# Patient Record
Sex: Male | Born: 1965 | Hispanic: Yes | Marital: Married | Smoking: Current some day smoker
Health system: Southern US, Community
[De-identification: ages and names within clinical notes are randomized; demographics above are authoritative.]

## PROBLEM LIST (undated history)

## (undated) DIAGNOSIS — Q600 Renal agenesis, unilateral: Secondary | ICD-10-CM

## (undated) DIAGNOSIS — J301 Allergic rhinitis due to pollen: Secondary | ICD-10-CM

## (undated) DIAGNOSIS — E785 Hyperlipidemia, unspecified: Secondary | ICD-10-CM

## (undated) DIAGNOSIS — G4730 Sleep apnea, unspecified: Secondary | ICD-10-CM

## (undated) DIAGNOSIS — IMO0002 Reserved for concepts with insufficient information to code with codable children: Secondary | ICD-10-CM

## (undated) HISTORY — DX: Hyperlipidemia, unspecified: E78.5

## (undated) HISTORY — DX: Renal agenesis, unilateral: Q60.0

## (undated) HISTORY — PX: OTHER SURGICAL HISTORY: SHX169

## (undated) HISTORY — DX: Sleep apnea, unspecified: G47.30

## (undated) HISTORY — DX: Allergic rhinitis due to pollen: J30.1

---

## 2011-06-13 ENCOUNTER — Encounter (HOSPITAL_BASED_OUTPATIENT_CLINIC_OR_DEPARTMENT_OTHER): Payer: Self-pay

## 2011-07-11 ENCOUNTER — Ambulatory Visit (HOSPITAL_BASED_OUTPATIENT_CLINIC_OR_DEPARTMENT_OTHER): Payer: BC Managed Care – PPO | Attending: Family Medicine | Admitting: Radiology

## 2011-07-11 VITALS — Ht 67.0 in | Wt 240.0 lb

## 2011-07-11 DIAGNOSIS — I4949 Other premature depolarization: Secondary | ICD-10-CM | POA: Insufficient documentation

## 2011-07-11 DIAGNOSIS — G4733 Obstructive sleep apnea (adult) (pediatric): Secondary | ICD-10-CM | POA: Insufficient documentation

## 2011-07-11 DIAGNOSIS — G4730 Sleep apnea, unspecified: Secondary | ICD-10-CM

## 2011-07-20 DIAGNOSIS — I4949 Other premature depolarization: Secondary | ICD-10-CM

## 2011-07-20 DIAGNOSIS — G4733 Obstructive sleep apnea (adult) (pediatric): Secondary | ICD-10-CM

## 2011-07-20 NOTE — Procedures (Signed)
NAME:  Bryan Case, Bryan Case NO.:  [id number]  MEDICAL RECORD NO.:  [id number]          PATIENT TYPE:  OUT  LOCATION:  SLEEP CENTER                 FACILITY:  [facility]  PHYSICIAN:  Clinton D. Maple Hudson, MD, FCCP, FACPDATE OF BIRTH:  1967-02-05  DATE OF STUDY:  07/11/2011                           NOCTURNAL POLYSOMNOGRAM  REFERRING PHYSICIAN:  Jamey Reas, MD  REFERRING PHYSICIAN:  Jamey Reas, MD  INDICATION FOR STUDY:  Hypersomnia with sleep apnea.  EPWORTH SLEEPINESS SCORE:  16/24.  BMI 37.6, weight 240 pounds, height 67 inches, neck 18.5 inches.  MEDICATIONS:  Home medications are charted and reviewed.  SLEEP ARCHITECTURE:  Split study protocol.  During the diagnostic phase, total sleep time 120 minutes with sleep efficiency 61.4%.  Stage I was 40.4%, stage II 59.6%, stages III and REM were absent.  Sleep latency 28.5 minutes, awake after sleep onset 50.5 minutes, arousal index 85.5.  Bedtime medication:  None.  RESPIRATORY DATA:  Apnea-hypotony index (AHI) 75.5 per hour.  A total of 151 events was scored including 58 obstructive apneas, 20 central apneas, 1 mixed apneas, 72 hypopneas.  Events were not positional.  CPAP was then titrated to 11 CWP, AHI 0 per hour .  He wore a large ResMed that Mirage SoftGel nasal mask with heated humidifier and C-Flex of 3.  OXYGEN DATA:  Moderately loud snoring before CPAP with oxygen desaturation to a nadir of 79%.  With CPAP titration, snoring was prevented and mean oxygen saturation held 93.9% on room air.  CARDIAC DATA:  Sinus rhythm with PVCs.  MOVEMENT-PARASOMNIA:  Occasional limb jerk with insignificant effect on sleep.  Bathroom x1.  IMPRESSIONS-RECOMMENDATIONS: 1. Severe obstructive sleep apnea/hypoxemia syndrome apnea/hypopnea     index 75.5 per hour with moderately loud snoring, non-positional     events and oxygen desaturation to a nadir of 79% on room air. 2. Successful continuous positive airway  pressure titration to 11 cm     of water pressure, apnea/hypopnea index 0     per hour.  He wore a large ResMed Mirage SoftGel nasal mask with     heated humidifier and C-Flex of 3.  Sleep was much less fragmented     and more normal once CPAP was established.     Clinton D. Maple Hudson, MD, [facility], FACP Diplomate, Biomedical engineer of Sleep Medicine Electronically Signed    CDY/MEDQ  D:  07/20/2011 09:39:03  T:  07/20/2011 09:49:27  Job:  [id number]

## 2011-09-13 ENCOUNTER — Other Ambulatory Visit: Payer: Self-pay | Admitting: Sports Medicine

## 2011-09-13 DIAGNOSIS — M25552 Pain in left hip: Secondary | ICD-10-CM

## 2011-09-19 ENCOUNTER — Ambulatory Visit
Admission: RE | Admit: 2011-09-19 | Discharge: 2011-09-19 | Disposition: A | Discharge status: Home / Self Care | Payer: BC Managed Care – PPO | Source: Ambulatory Visit | Attending: Sports Medicine | Admitting: Sports Medicine

## 2011-09-19 DIAGNOSIS — M25552 Pain in left hip: Secondary | ICD-10-CM

## 2012-01-07 ENCOUNTER — Emergency Department (INDEPENDENT_AMBULATORY_CARE_PROVIDER_SITE_OTHER): Payer: BC Managed Care – PPO

## 2012-01-07 ENCOUNTER — Encounter (HOSPITAL_COMMUNITY): Payer: Self-pay | Admitting: *Deleted

## 2012-01-07 ENCOUNTER — Emergency Department (INDEPENDENT_AMBULATORY_CARE_PROVIDER_SITE_OTHER)
Admission: EM | Admit: 2012-01-07 | Discharge: 2012-01-07 | Disposition: A | Discharge status: Home / Self Care | Payer: BC Managed Care – PPO | Source: Home / Self Care | Attending: Emergency Medicine | Admitting: Emergency Medicine

## 2012-01-07 DIAGNOSIS — K5732 Diverticulitis of large intestine without perforation or abscess without bleeding: Secondary | ICD-10-CM

## 2012-01-07 DIAGNOSIS — K5792 Diverticulitis of intestine, part unspecified, without perforation or abscess without bleeding: Secondary | ICD-10-CM

## 2012-01-07 HISTORY — DX: Reserved for concepts with insufficient information to code with codable children: IMO0002

## 2012-01-07 HISTORY — DX: Renal agenesis, unilateral: Q60.0

## 2012-01-07 LAB — CBC
HCT: 43 % (ref 39.0–52.0)
MCH: 28.2 pg (ref 26.0–34.0)
MCV: 82.5 fL (ref 78.0–100.0)
Platelets: 232 10*3/uL (ref 150–400)
RDW: 12.7 % (ref 11.5–15.5)

## 2012-01-07 LAB — DIFFERENTIAL
Basophils Absolute: 0 10*3/uL (ref 0.0–0.1)
Eosinophils Absolute: 0.1 10*3/uL (ref 0.0–0.7)
Eosinophils Relative: 2 % (ref 0–5)
Lymphocytes Relative: 26 % (ref 12–46)
Lymphs Abs: 1.8 10*3/uL (ref 0.7–4.0)
Monocytes Absolute: 0.5 10*3/uL (ref 0.1–1.0)

## 2012-01-07 LAB — POCT URINALYSIS DIP (DEVICE)
Glucose, UA: NEGATIVE mg/dL
Ketones, ur: NEGATIVE mg/dL
Specific Gravity, Urine: >=1.03 (ref 1.005–1.030)
Urobilinogen, UA: 0.2 mg/dL (ref 0.0–1.0)

## 2012-01-07 LAB — OCCULT BLOOD, POC DEVICE: Fecal Occult Bld: NEGATIVE

## 2012-01-07 MED ORDER — CIPROFLOXACIN HCL 500 MG PO TABS: 500.0000 mg | ORAL_TABLET | Freq: Two times a day (BID) | ORAL | Status: AC

## 2012-01-07 MED ORDER — TRAMADOL HCL 50 MG PO TABS: 100.0000 mg | ORAL_TABLET | Freq: Three times a day (TID) | ORAL | Status: AC | PRN

## 2012-01-07 MED ORDER — METRONIDAZOLE 500 MG PO TABS: 500.0000 mg | ORAL_TABLET | Freq: Two times a day (BID) | ORAL | Status: AC

## 2012-01-07 NOTE — ED Notes (Signed)
Pt  Reports  l  Side pain  Radiating  Downward  Into llq   Started  Today    -  denys  Any  Nausea  Or  Any  Vomiting  denys  Any  Diarrhea      Pt  Reports  He  Only  Has  One  Kidney   Stone      Which is  On the    r  Side  -  He  Reports  He  Has  Had  Similar  Episode     Like  This  In  Past  But it went away

## 2012-01-07 NOTE — Discharge Instructions (Signed)
Diverticulitis A diverticulum is a small pouch or sac on the colon. Diverticulosis is the presence of these diverticula on the colon. Diverticulitis is the irritation (inflammation) or infection of diverticula. CAUSES  The colon and its diverticula contain bacteria. If food particles block the tiny opening to a diverticulum, the bacteria inside can grow and cause an increase in pressure. This leads to infection and inflammation and is called diverticulitis. SYMPTOMS   Abdominal pain and tenderness. Usually, the pain is located on the left side of your abdomen. However, it could be located elsewhere.   Fever.   Bloating.   Feeling sick to your stomach (nausea).   Throwing up (vomiting).   Abnormal stools.  DIAGNOSIS  Your caregiver will take a history and perform a physical exam. Since many things can cause abdominal pain, other tests may be necessary. Tests may include:  Blood tests.   Urine tests.   X-ray of the abdomen.   CT scan of the abdomen.  Sometimes, surgery is needed to determine if diverticulitis or other conditions are causing your symptoms. TREATMENT  Most of the time, you can be treated without surgery. Treatment includes:  Resting the bowels by only having liquids for a few days. As you improve, you will need to eat a low-fiber diet.   Intravenous (IV) fluids if you are losing body fluids (dehydrated).   Antibiotic medicines that treat infections may be given.   Pain and nausea medicine, if needed.   Surgery if the inflamed diverticulum has burst.  HOME CARE INSTRUCTIONS   Try a clear liquid diet (broth, tea, or water for as long as directed by your caregiver). You may then gradually begin a low-fiber diet as tolerated. A low-fiber diet is a diet with less than 10 grams of fiber. Choose the foods below to reduce fiber in the diet:   White breads, cereals, rice, and pasta.   Cooked fruits and vegetables or soft fresh fruits and vegetables without the skin.     Ground or well-cooked tender beef, ham, veal, lamb, pork, or poultry.   Eggs and seafood.   After your diverticulitis symptoms have improved, your caregiver may put you on a high-fiber diet. A high-fiber diet includes 14 grams of fiber for every 1000 calories consumed. For a standard 2000 calorie diet, you would need 28 grams of fiber. Follow these diet guidelines to help you increase the fiber in your diet. It is important to slowly increase the amount fiber in your diet to avoid gas, constipation, and bloating.   Choose whole-grain breads, cereals, pasta, and brown rice.   Choose fresh fruits and vegetables with the skin on. Do not overcook vegetables because the more vegetables are cooked, the more fiber is lost.   Choose more nuts, seeds, legumes, dried peas, beans, and lentils.   Look for food products that have greater than 3 grams of fiber per serving on the Nutrition Facts label.   Take all medicine as directed by your caregiver.   If your caregiver has given you a follow-up appointment, it is very important that you go. Not going could result in lasting (chronic) or permanent injury, pain, and disability. If there is any problem keeping the appointment, call to reschedule.  SEEK MEDICAL CARE IF:   Your pain does not improve.   You have a hard time advancing your diet beyond clear liquids.   Your bowel movements do not return to normal.  SEEK IMMEDIATE MEDICAL CARE IF:   Your pain becomes   worse.   You have an oral temperature above 102 F (38.9 C), not controlled by medicine.   You have repeated vomiting.   You have bloody or black, tarry stools.   Symptoms that brought you to your caregiver become worse or are not getting better.  MAKE SURE YOU:   Understand these instructions.   Will watch your condition.   Will get help right away if you are not doing well or get worse.  Document Released: 05/08/2005 Document Revised: 07/18/2011 Document Reviewed:  09/03/2010 [facility] Patient Information 2012 [place], [place].   Liquid diet for next 2 days, then regular diet, but no nuts, seeds, or corn.  Follow up with Dr. Cyndia Bent in 1 week.

## 2012-01-07 NOTE — ED Provider Notes (Signed)
Chief Complaint  Patient presents with  . Abdominal Pain    History of Present Illness:   The patient is a 46 year old male with a five-year history of intermittent abdominal pain. This seems to begin the left flank pain radiates towards the left groin. It comes and goes and occurs about every other week. It sometimes seems to be brought on by sexual intercourse. The pain can last about 2-3 days at a time. Current episode began this morning. Sharp and stabbing and rated 7-8/10 in intensity. The patient states she's been back and forth to many doctors for this pain. His current doctor is Dr. Customer service manager. Nothing has been found in many tests. 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 He had a colonoscopy about 5 years ago. He has a history of some type of hepatitis in the past. He denies fever, chills, anorexia, or weight loss. No nausea, vomiting, diarrhea, constipation, or blood in the stool. No urinary symptoms.  Review of Systems:  Other than noted above, the patient denies any of the following symptoms: Constitutional:  No fever, chills, fatigue, weight loss or anorexia. Lungs:  No cough or shortness of breath. Heart:  No chest pain, palpitations, syncope or edema.  No cardiac history. Abdomen:  No nausea, vomiting, hematememesis, melena, diarrhea, or hematochezia. GU:  No dysuria, frequency, urgency, or hematuria.  No testicular pain or swelling. Skin:  No rash or itching.  PMFSH:  Past medical history, family history, social history, meds, and allergies were reviewed.  No prior abdominal surgeries or history of GI problems.  No use of NSAIDs or aspirin.  No excessive  alcohol intake.  Physical Exam:   Vital signs:  BP 122/78  Pulse 94  Temp(Src) 98.2 F (36.8 C) (Oral)  Resp 18  SpO2 96% Gen:  Alert, oriented, in no distress. Lungs:  Breath sounds clear and equal bilaterally.  No wheezes, rales or rhonchi. Heart:  Regular rhythm.   No gallops or murmers.   Abdomen:  Abdomen was flat, soft, nondistended. There was mild pain to palpation in the left flank area without guarding or rebound. No organomegaly or mass. Bowel sounds are normally active. Genital exam:  Normal external genitalia, no testicular tenderness, no hernia. Digital rectal exam reveals no masses, normal prostate, no prostatic tenderness, and heme-negative stool. Skin:  Clear, warm and dry.  No rash.  Labs:   Results for orders placed during the hospital encounter of 01/07/12  POCT URINALYSIS DIP (DEVICE)      Component Value Range   Glucose, UA NEGATIVE  NEGATIVE (mg/dL)   Bilirubin Urine SMALL (*) NEGATIVE    Ketones, ur NEGATIVE  NEGATIVE (mg/dL)   Specific Gravity, Urine >=1.030  1.005 - 1.030    Hgb urine dipstick NEGATIVE  NEGATIVE    pH 5.5  5.0 - 8.0    Protein, ur NEGATIVE  NEGATIVE (mg/dL)   Urobilinogen, UA 0.2  0.0 - 1.0 (mg/dL)   Nitrite NEGATIVE  NEGATIVE    Leukocytes, UA NEGATIVE  NEGATIVE   CBC      Component Value Range   WBC 7.2  4.0 - 10.5 (K/uL)   RBC 5.21  4.22 - 5.81 (MIL/uL)   Hemoglobin 14.7  13.0 - 17.0 (g/dL)   HCT 16.1  09.6 - 04.5 (%)   MCV 82.5  78.0 - 100.0 (fL)   MCH 28.2  26.0 - 34.0 (pg)   MCHC  34.2  30.0 - 36.0 (g/dL)   RDW 41.3  24.4 - 01.0 (%)   Platelets 232  150 - 400 (K/uL)  DIFFERENTIAL      Component Value Range   Neutrophils Relative 64  43 - 77 (%)   Neutro Abs 4.6  1.7 - 7.7 (K/uL)   Lymphocytes Relative 26  12 - 46 (%)   Lymphs Abs 1.8  0.7 - 4.0 (K/uL)   Monocytes Relative 7  3 - 12 (%)   Monocytes Absolute 0.5  0.1 - 1.0 (K/uL)   Eosinophils Relative 2  0 - 5 (%)   Eosinophils Absolute 0.1  0.0 - 0.7 (K/uL)   Basophils Relative 1  0 - 1 (%)   Basophils Absolute 0.0  0.0 - 0.1 (K/uL)  OCCULT BLOOD, POC DEVICE      Component Value Range   Fecal Occult Bld NEGATIVE       Radiology:  Dg Abd Acute W/chest  01/07/2012  *RADIOLOGY REPORT*  Clinical Data: Left flank pain radiating to the groin  for 3 days  ACUTE ABDOMEN SERIES (ABDOMEN 2 VIEW & CHEST 1 VIEW)  Comparison: None.  Findings: The lungs are clear.  The heart is within upper limits of normal.  No bony abnormality is seen.  Supine and erect views of the abdomen show no bowel obstruction. On the chest x-ray no free air is seen on the erect view.  No opaque calculi are noted.  There are degenerative changes in the lower lumbar spine.  IMPRESSION:  1.  No active lung disease. 2.  No bowel obstruction.  No free air.  Original Report Authenticated By: Juline Patch, M.D.    Assessment:  The encounter diagnosis was Diverticulitis. Differential diagnosis includes diverticulitis, urinary tract infection, irritable bowel syndrome, or colitis.  Plan:   1.  The following meds were prescribed:   New Prescriptions   CIPROFLOXACIN (CIPRO) 500 MG TABLET    Take 1 tablet (500 mg total) by mouth every 12 (twelve) hours.   METRONIDAZOLE (FLAGYL) 500 MG TABLET    Take 1 tablet (500 mg total) by mouth 2 (two) times daily.   TRAMADOL (ULTRAM) 50 MG TABLET    Take 2 tablets (100 mg total) by mouth every 8 (eight) hours as needed for pain.   2.  The patient was instructed in symptomatic care and handouts were given. 3.  The patient was told to return if becoming worse in any way, if no better in 3 or 4 days, and given some red flag symptoms that would indicate earlier return.  Follow up:  The patient was told to follow up with Dr. Cyndia Bent in one week, or if he should get worse in any way, to go directly to the hospital emergency room.    Reuben Likes, MD 01/07/12 2228

## 2012-08-19 DIAGNOSIS — K145 Plicated tongue: Secondary | ICD-10-CM | POA: Insufficient documentation

## 2012-08-19 DIAGNOSIS — E119 Type 2 diabetes mellitus without complications: Secondary | ICD-10-CM | POA: Insufficient documentation

## 2013-05-15 ENCOUNTER — Encounter (HOSPITAL_COMMUNITY): Payer: Self-pay | Admitting: *Deleted

## 2013-05-15 ENCOUNTER — Emergency Department (INDEPENDENT_AMBULATORY_CARE_PROVIDER_SITE_OTHER)
Admission: EM | Admit: 2013-05-15 | Discharge: 2013-05-15 | Disposition: A | Discharge status: Home / Self Care | Payer: BC Managed Care – PPO | Source: Home / Self Care | Attending: Emergency Medicine | Admitting: Emergency Medicine

## 2013-05-15 DIAGNOSIS — H60399 Other infective otitis externa, unspecified ear: Secondary | ICD-10-CM

## 2013-05-15 DIAGNOSIS — H6092 Unspecified otitis externa, left ear: Secondary | ICD-10-CM

## 2013-05-15 MED ORDER — CIPROFLOXACIN-DEXAMETHASONE 0.3-0.1 % OT SUSP: 4.0000 [drp] | Freq: Two times a day (BID) | OTIC | Status: DC

## 2013-05-15 MED ORDER — AMOXICILLIN-POT CLAVULANATE 875-125 MG PO TABS: 1.0000 | ORAL_TABLET | Freq: Two times a day (BID) | ORAL | Status: DC

## 2013-05-15 NOTE — ED Provider Notes (Signed)
Chief Complaint:   Chief Complaint  Patient presents with  . Otalgia    History of Present Illness:   Bryan Case. is a 47 year old male who has had a two-day history of left ear pain and sensation of congestion. There's been no drainage. He's had some postnasal drainage and cough. No fever, chills, headache, nasal congestion, nasal drainage, sore throat, or adenopathy. He has diet controlled diabetes and sleep apnea.  Review of Systems:  Other than noted above, the patient denies any of the following symptoms: Systemic:  No fevers, chills, sweats, weight loss or gain, fatigue, or tiredness. Eye:  No redness, pain, discharge, itching, blurred vision, or diplopia. ENT:  No headache, nasal congestion, sneezing, itching, epistaxis, ear pain, congestion, decreased hearing, ringing in ears, vertigo, or tinnitus.  No oral lesions, sore throat, pain on swallowing, or hoarseness. Neck:  No mass, tenderness or adenopathy. Lungs:  No coughing, wheezing, or shortness of breath. Skin:  No rash or itching.  PMFSH:  Past medical history, family history, social history, meds, and allergies were reviewed.   Physical Exam:   Vital signs:  BP 127/80  Pulse 70  Temp(Src) 98.2 F (36.8 C) (Oral)  Resp 17  SpO2 100% General:  Alert and oriented.  In no distress.  Skin warm and dry. Eye:  PERRL, full EOMs, lids and conjunctiva normal.   ENT:  He had 2 swollen areas in his distal, external ear canal, just inside the canal, suggesting small furuncles. Neither these was draining any pus or felt fluctuant but there were tender to palpation. Was unable to see much of the ear canal or TM itself due to the swelling of the external ear canal.  Nasal mucosa not congested and without drainage.  Mucous membranes moist, no oral lesions, normal dentition, pharynx clear.  No cranial or facial pain to palplation. Neck:  Supple, full ROM.  No adenopathy, tenderness or mass.  Thyroid normal. Lungs:  Breath sounds clear and  equal bilaterally.  No wheezes, rales or rhonchi. Heart:  Rhythm regular, without extrasystoles.  No gallops or murmers. Skin:  Clear, warm and dry.  Course in Urgent Care Center:   An ear wick was inserted.  Assessment:  The encounter diagnosis was Otitis externa, left.  Otitis externa versus furuncles of external ear canal.  Plan:   1.  Meds:  The following meds were prescribed:   Discharge Medication List as of 05/15/2013  1:33 PM    START taking these medications   Details  amoxicillin-clavulanate (AUGMENTIN) 875-125 MG per tablet Take 1 tablet by mouth 2 (two) times daily., Starting 05/15/2013, Until Discontinued, Normal    ciprofloxacin-dexamethasone (CIPRODEX) otic suspension Place 4 drops into the left ear 2 (two) times daily., Starting 05/15/2013, Until Discontinued, Normal       The patient called back and states he could not afford the Ciprodex. Prescription was sent instead for Cortisporin otic suspension, 3 drops in left ear 4 times daily.  2.  Patient Education/Counseling:  The patient was given appropriate handouts, self care instructions, and instructed in symptomatic relief.  Should keep water out of the ear.  3.  Follow up:  The patient was told to follow up if no better in 3 to 4 days, if becoming worse in any way, and given some red flag symptoms such as worsening pain which would prompt immediate return.  Follow up with Dr. Suzanna Obey as needed.     Reuben Likes, MD 05/15/13 2033

## 2013-05-15 NOTE — ED Notes (Signed)
Pt  Reports  l  Earache         X  2  Days    With           Symptoms  Of  Muffled      Sounds            The  Pt        Reports  Pain is  In  The  Canal           Pt  Has  A  History  Of  Diabetes   But  Takes  No  meds

## 2013-05-15 NOTE — ED Notes (Signed)
Pharmacy called stating that pt can't afford the Ciprodex Per Dr. Lorenz Coaster... Called in cortisporin otic solution... 3 drops to left ear QID... 10ml ... NR

## 2014-08-12 HISTORY — PX: NECK SURGERY: SHX720

## 2014-12-13 ENCOUNTER — Emergency Department (HOSPITAL_COMMUNITY)
Admission: EM | Admit: 2014-12-13 | Discharge: 2014-12-14 | Disposition: A | Discharge status: Home / Self Care | Payer: 59 | Attending: Emergency Medicine | Admitting: Emergency Medicine

## 2014-12-13 ENCOUNTER — Encounter (HOSPITAL_COMMUNITY): Payer: Self-pay | Admitting: Family Medicine

## 2014-12-13 ENCOUNTER — Emergency Department (HOSPITAL_COMMUNITY): Payer: 59

## 2014-12-13 DIAGNOSIS — M542 Cervicalgia: Secondary | ICD-10-CM | POA: Diagnosis not present

## 2014-12-13 DIAGNOSIS — Z87448 Personal history of other diseases of urinary system: Secondary | ICD-10-CM | POA: Insufficient documentation

## 2014-12-13 DIAGNOSIS — R0789 Other chest pain: Secondary | ICD-10-CM | POA: Diagnosis not present

## 2014-12-13 DIAGNOSIS — R9431 Abnormal electrocardiogram [ECG] [EKG]: Secondary | ICD-10-CM | POA: Insufficient documentation

## 2014-12-13 DIAGNOSIS — Z79899 Other long term (current) drug therapy: Secondary | ICD-10-CM | POA: Diagnosis not present

## 2014-12-13 DIAGNOSIS — R079 Chest pain, unspecified: Secondary | ICD-10-CM | POA: Diagnosis present

## 2014-12-13 DIAGNOSIS — R202 Paresthesia of skin: Secondary | ICD-10-CM | POA: Diagnosis not present

## 2014-12-13 DIAGNOSIS — E119 Type 2 diabetes mellitus without complications: Secondary | ICD-10-CM | POA: Insufficient documentation

## 2014-12-13 DIAGNOSIS — R0602 Shortness of breath: Secondary | ICD-10-CM | POA: Diagnosis not present

## 2014-12-13 DIAGNOSIS — R000 Tachycardia, unspecified: Secondary | ICD-10-CM | POA: Diagnosis not present

## 2014-12-13 LAB — CBC
HCT: 46.4 % (ref 39.0–52.0)
HEMOGLOBIN: 15.5 g/dL (ref 13.0–17.0)
MCH: 27.9 pg (ref 26.0–34.0)
MCHC: 33.4 g/dL (ref 30.0–36.0)
MCV: 83.6 fL (ref 78.0–100.0)
Platelets: 270 10*3/uL (ref 150–400)
RBC: 5.55 MIL/uL (ref 4.22–5.81)
RDW: 12.6 % (ref 11.5–15.5)
WBC: 8 10*3/uL (ref 4.0–10.5)

## 2014-12-13 LAB — BASIC METABOLIC PANEL
Anion gap: 10 (ref 5–15)
BUN: 13 mg/dL (ref 6–20)
CALCIUM: 8.9 mg/dL (ref 8.9–10.3)
CO2: 24 mmol/L (ref 22–32)
Chloride: 106 mmol/L (ref 101–111)
Creatinine, Ser: 0.88 mg/dL (ref 0.61–1.24)
GFR calc Af Amer: >60 mL/min (ref >60)
GLUCOSE: 113 mg/dL — AB (ref 70–99)
Potassium: 3.5 mmol/L (ref 3.5–5.1)
SODIUM: 140 mmol/L (ref 135–145)

## 2014-12-13 LAB — I-STAT TROPONIN, ED: Troponin i, poc: 0.01 ng/mL (ref 0.00–0.08)

## 2014-12-13 LAB — BRAIN NATRIURETIC PEPTIDE: B NATRIURETIC PEPTIDE 5: 16.6 pg/mL (ref 0.0–100.0)

## 2014-12-13 MED ORDER — MORPHINE SULFATE 4 MG/ML IJ SOLN
4.0000 mg | Freq: Once | INTRAMUSCULAR | Status: AC
  Administered 2014-12-14: 4 mg via INTRAVENOUS
  Filled 2014-12-13: qty 1

## 2014-12-13 MED ORDER — ASPIRIN 325 MG PO TABS
325.0000 mg | ORAL_TABLET | Freq: Once | ORAL | Status: AC
  Administered 2014-12-14: 325 mg via ORAL
  Filled 2014-12-13: qty 1

## 2014-12-13 NOTE — ED Provider Notes (Signed)
CSN: [id number]     Arrival date & time 12/13/14  2135 History   First MD Initiated Contact with Patient 12/13/14 2324     Chief Complaint  Patient presents with  . Chest Pain     (Consider location/radiation/quality/duration/timing/severity/associated sxs/prior Treatment) HPI Comments: Bryan Case. is a 49 y.o. male with a PMHx of DM2 and solitary kidney, who presents to the ED with complaints of 2 days of constant substernal and left-sided chest pain which has waxed and waned and become more severe today. He reports that the pain is 6/10 located in the substernal and left chest wall area, nonradiating, constant, sharp in nature, worse with deep inspiration, unchanged with exertion or movement, and no known alleviating factors given that he has not tried anything prior to arrival. He admits to being under a lot of stress and wonders whether this is contributing to his chest pain. He denies any radiation of his symptoms to his jaw/arm/neck, although he states that for the last 3 months he has been having ongoing neck pain and bilateral arm paresthesias and pain for which she was seen at [facility] in [place] and he has an MRI and EMG/NCS that is scheduled, and states he has no change in this symptom now. He reports associated shortness of breath. He denies any diaphoresis, lightheadedness, dizziness, fevers, chills, cough, hemoptysis, wheezing, leg swelling, surgery/immobilization/travel, history of DVT, abdominal pain, dysphagia, heartburn, nausea, vomiting, diarrhea, constipation, dysuria, hematuria, new/changed numbness or tingling, focal weakness, headache, vision changes, claudication, orthopnea, or PND. He is a nonsmoker. He has a FHx of PE in his father, who died of PE at age 55. No other family hx of DVT/PE or cardiac disease. No known cardiac disease in himself.  Patient is a 49 y.o. male presenting with chest pain. The history is provided by the patient. No language interpreter was  used.  Chest Pain Pain location:  Substernal area and L chest Pain quality: sharp   Pain radiates to:  Does not radiate Pain radiates to the back: no   Pain severity:  Moderate Onset quality:  Gradual Duration:  2 days Timing:  Constant Progression:  Waxing and waning Chronicity:  New Context: at rest   Relieved by:  None tried Worsened by:  Deep breathing Ineffective treatments:  None tried Associated symptoms: shortness of breath   Associated symptoms: no abdominal pain, no back pain, no claudication, no cough, no diaphoresis, no dizziness, no fever, no headache, no heartburn, no lower extremity edema, no nausea, no near-syncope, no numbness, no orthopnea, no palpitations, no PND, not vomiting and no weakness   Risk factors: diabetes mellitus and male sex   Risk factors: no coronary artery disease, no high cholesterol, no hypertension, no immobilization, no prior DVT/PE, no smoking and no surgery     Past Medical History  Diagnosis Date  . Diabetes mellitus   . Solitary kidney    History reviewed. No pertinent past surgical history. History reviewed. No pertinent family history. History  Substance Use Topics  . Smoking status: Never Smoker   . Smokeless tobacco: Not on file  . Alcohol Use: No    Review of Systems  Constitutional: Negative for fever, chills and diaphoresis.  Eyes: Negative for visual disturbance.  Respiratory: Positive for shortness of breath. Negative for cough and wheezing.   Cardiovascular: Positive for chest pain. Negative for palpitations, orthopnea, claudication, leg swelling, PND and near-syncope.  Gastrointestinal: Negative for heartburn, nausea, vomiting, abdominal pain, diarrhea and constipation.  Genitourinary:  Negative for dysuria and hematuria.  Musculoskeletal: Positive for neck pain (ongoing x3 months, evaluated by neurology this AM, no acute changes in this). Negative for myalgias, back pain and arthralgias.  Skin: Negative for rash.    Allergic/Immunologic: Positive for immunocompromised state (diabetic).  Neurological: Negative for dizziness, syncope, weakness, light-headedness, numbness and headaches.       +paresthesias b/l upper extremities ongoing x3 months, already seen by neurology, no acute changes  Psychiatric/Behavioral: Negative for confusion.   10 Systems reviewed and are negative for acute change except as noted in the HPI.    Allergies  Review of patient's allergies indicates no known allergies.  Home Medications   Prior to Admission medications   Medication Sig Start Date End Date Taking? Authorizing Provider  amoxicillin-clavulanate (AUGMENTIN) 875-125 MG per tablet Take 1 tablet by mouth 2 (two) times daily. 05/15/13   Reuben Likesavid C Keller, MD  ciprofloxacin-dexamethasone [place](CIPRODEX) otic suspension Place 4 drops into the left ear 2 (two) times daily. 05/15/13   Reuben Likesavid C Keller, MD   BP 119/68 mmHg  Pulse 73  Temp(Src) 97.9 F (36.6 C) (Oral)  Resp 18  SpO2 97% Physical Exam  Constitutional: He is oriented to person, place, and time. Vital signs are normal. He appears well-developed and well-nourished.  Non-toxic appearance. No distress.  Afebrile, nontoxic, NAD, VSS although triage vitals showed tachycardia initially  HENT:  Head: Normocephalic and atraumatic.  Mouth/Throat: Oropharynx is clear and moist and mucous membranes are normal.  Eyes: Conjunctivae and EOM are normal. Right eye exhibits no discharge. Left eye exhibits no discharge.  Neck: Normal range of motion. Neck supple. No JVD present.  No JVD  Cardiovascular: Normal rate, regular rhythm, normal heart sounds and intact distal pulses.  Exam reveals no gallop and no friction rub.   No murmur heard. Triage vitals showed tachycardia which resolved upon exam, HR 73, RRR, nl s1/s2, no m/r/g, distal pulses intact, no pedal edema   Pulmonary/Chest: Effort normal and breath sounds normal. No respiratory distress. He has no decreased breath sounds.  He has no wheezes. He has no rhonchi. He has no rales. He exhibits tenderness. He exhibits no crepitus, no deformity and no retraction.    CTAB in all lung fields, no w/r/r, no hypoxia or increased WOB, speaking in full sentences, SpO2 97% on RA Chest wall TTP over L chest, no crepitus or deformity, no retractions.  Abdominal: Soft. Normal appearance and bowel sounds are normal. He exhibits no distension. There is no tenderness. There is no rigidity, no rebound, no guarding, no CVA tenderness, no tenderness at McBurney's point and negative Murphy's sign.  Musculoskeletal: Normal range of motion.  MAE x4 Strength and sensation grossly intact Distal pulses intact No pedal edema, neg homan's bilaterally   Neurological: He is alert and oriented to person, place, and time. He has normal strength. No sensory deficit.  Skin: Skin is warm, dry and intact. No rash noted.  Psychiatric: He has a normal mood and affect.  Nursing note and vitals reviewed.   ED Course  Procedures (including critical care time) Labs Review Labs Reviewed  BASIC METABOLIC PANEL - Abnormal; Notable for the following:    Glucose, Bld 113 (*)    All other components within normal limits  CBC  BRAIN NATRIURETIC PEPTIDE  D-DIMER, QUANTITATIVE  I-STAT TROPOININ, ED  Rosezena SensorI-STAT TROPOININ, ED    Imaging Review Dg Chest 2 View  12/13/2014   CLINICAL DATA:  Shortness of breath with mid chest pain for 2  days, worsening numbness and tingling in both hands, history diabetes  EXAM: CHEST  2 VIEW  COMPARISON:  None  FINDINGS: Normal heart size, mediastinal contours, and pulmonary vascularity.  Mild peribronchial thickening.  No pulmonary infiltrate, pleural effusion, or pneumothorax.  Bones unremarkable.  IMPRESSION: Mild bronchitic changes.   Electronically Signed   By: Ulyses Southward M.D.   On: 12/13/2014 21:56     EKG Interpretation   Date/Time:  Tuesday Dec 13 2014 21:38:09 EDT Ventricular Rate:  83 PR Interval:  132 QRS  Duration: 106 QT Interval:  340 QTC Calculation: 399 R Axis:   84 Text Interpretation:  Sinus rhythm with marked sinus arrhythmia ST \\T \ T  wave abnormality, consider inferior ischemia Abnormal ECG No previous  tracing Confirmed by BEATON  MD, ROBERT ([id number]) on 12/13/2014 9:41:10 PM      MDM   Final diagnoses:  Musculoskeletal chest pain  SOB (shortness of breath)  Abnormal EKG    49 y.o. male here with chest pain and SOB that's reproducible on exam, ongoing x2 days but this morning it became more intense. Pain is pleuritic, and pt has family hx of PE in father resulting in death at 30y/o. Pt was initially tachycardic, therefore given these findings will proceed with dimer. On exam, tachycardia had resolved. Will give ASA but will hold NTG to avoid hypotension. Will avoid toradol since pt only has one kidney. Will give Morphine for pain. Initial trop neg, will recheck now at 3hr mark since his CP worsened today. CBC and BMP WNL. BNP WNL. CXR with mild bronchitic changes, but pt without cough, nonsmoker. EKG with sinus arrhythmia and some T wave inversions but no prior tracing for comparison. Will reassess after second trop and d-dimer.    12:47 AM Chest pain improving. Second trop and dimer neg. Will have him f/up with PCP in 1wk, likely musculoskeletal pain. Discussed advil/tylenol as needed for pain, and heat to the affected area. I explained the diagnosis and have given explicit precautions to return to the ER including for any other new or worsening symptoms. The patient understands and accepts the medical plan as it's been dictated and I have answered their questions. Discharge instructions concerning home care and prescriptions have been given. The patient is STABLE and is discharged to home in good condition.  BP 119/68 mmHg  Pulse 73  Temp(Src) 97.9 F (36.6 C) (Oral)  Resp 18  SpO2 97%  Meds ordered this encounter  Medications  . aspirin tablet 325 mg    Sig:   . morphine 4  MG/ML injection 4 mg    SigAllen Derry, PA-C 12/14/14 0047  April Palumbo, MD 12/14/14 [phone]

## 2014-12-13 NOTE — ED Notes (Signed)
Phlebotomy at the bedside  

## 2014-12-13 NOTE — ED Notes (Signed)
Mercedes, pa, at the bedside.  

## 2014-12-13 NOTE — ED Notes (Signed)
Pt here for chest pain x 3 days, SOB and arm pain and numbness. Denies cough, fever.

## 2014-12-14 LAB — I-STAT TROPONIN, ED: Troponin i, poc: 0 ng/mL (ref 0.00–0.08)

## 2014-12-14 LAB — D-DIMER, QUANTITATIVE (NOT AT ARMC)

## 2014-12-14 NOTE — Discharge Instructions (Signed)
Use tylenol and motrin as needed for pain. Use heat to the affected area of pain. Stay well hydrated. Follow up with your regular doctor in 1 week for recheck of today's symptoms. Return to the ER for changes or worsening symptoms.   Chest Wall Pain Chest wall pain is pain in or around the bones and muscles of your chest. It may take up to 6 weeks to get better. It may take longer if you must stay physically active in your work and activities.  CAUSES  Chest wall pain may happen on its own. However, it may be caused by:  A viral illness like the flu.  Injury.  Coughing.  Exercise.  Arthritis.  Fibromyalgia.  Shingles. HOME CARE INSTRUCTIONS   Avoid overtiring physical activity. Try not to strain or perform activities that cause pain. This includes any activities using your chest or your abdominal and side muscles, especially if heavy weights are used.  Put ice on the sore area.  Put ice in a plastic bag.  Place a towel between your skin and the bag.  Leave the ice on for 15-20 minutes per hour while awake for the first 2 days.  Only take over-the-counter or prescription medicines for pain, discomfort, or fever as directed by your caregiver. SEEK IMMEDIATE MEDICAL CARE IF:   Your pain increases, or you are very uncomfortable.  You have a fever.  Your chest pain becomes worse.  You have new, unexplained symptoms.  You have nausea or vomiting.  You feel sweaty or lightheaded.  You have a cough with phlegm (sputum), or you cough up blood. MAKE SURE YOU:   Understand these instructions.  Will watch your condition.  Will get help right away if you are not doing well or get worse. Document Released: 07/29/2005 Document Revised: 10/21/2011 Document Reviewed: 03/25/2011 [facility] CenterExit[facility] Patient Information 2015 [place], [place]. This information is not intended to replace advice given to you by your health care provider. Make sure you discuss any questions you have with your  health care provider.  Shortness of Breath Shortness of breath means you have trouble breathing. Shortness of breath needs medical care right away. HOME CARE   Do not smoke.  Avoid being around chemicals or things (paint fumes, dust) that may bother your breathing.  Rest as needed. Slowly begin your normal activities.  Only take medicines as told by your doctor.  Keep all doctor visits as told. GET HELP RIGHT AWAY IF:   Your shortness of breath gets worse.  You feel lightheaded, pass out (faint), or have a cough that is not helped by medicine.  You cough up blood.  You have pain with breathing.  You have pain in your chest, arms, shoulders, or belly (abdomen).  You have a fever.  You cannot walk up stairs or exercise the way you normally do.  You do not get better in the time expected.  You have a hard time doing normal activities even with rest.  You have problems with your medicines.  You have any new symptoms. MAKE SURE YOU:  Understand these instructions.  Will watch your condition.  Will get help right away if you are not doing well or get worse. Document Released: 01/15/2008 Document Revised: 08/03/2013 Document Reviewed: 10/14/2011 [facility] Patient Information 2015 [place], [place]. This information is not intended to replace advice given to you by your health care provider. Make sure you discuss any questions you have with your health care provider.

## 2015-01-11 DIAGNOSIS — G4730 Sleep apnea, unspecified: Secondary | ICD-10-CM | POA: Insufficient documentation

## 2015-01-11 DIAGNOSIS — M4802 Spinal stenosis, cervical region: Secondary | ICD-10-CM | POA: Insufficient documentation

## 2015-02-14 ENCOUNTER — Other Ambulatory Visit: Payer: Self-pay

## 2015-02-14 MED ORDER — HYDROCODONE-ACETAMINOPHEN 5-325 MG PO TABS: 1.0000 | ORAL_TABLET | Freq: Four times a day (QID) | ORAL | Status: DC | PRN

## 2015-02-14 NOTE — Telephone Encounter (Signed)
RX faxed to [facility] @ [phone], phone number [phone] 

## 2015-12-07 LAB — HM HIV SCREENING LAB: HM HIV SCREENING: NEGATIVE

## 2015-12-07 LAB — TSH: TSH: 1.85 (ref 0.41–5.90)

## 2015-12-07 LAB — HM HEPATITIS C SCREENING LAB: HM HEPATITIS C SCREENING: NEGATIVE

## 2016-01-24 DIAGNOSIS — K649 Unspecified hemorrhoids: Secondary | ICD-10-CM | POA: Insufficient documentation

## 2016-01-24 DIAGNOSIS — K602 Anal fissure, unspecified: Secondary | ICD-10-CM | POA: Insufficient documentation

## 2016-08-30 IMAGING — DX DG CHEST 2V
2 series · 2 of 2 positions shown · non-contrast
Comparison: None

CLINICAL DATA: Shortness of breath with mid chest pain for 2 days,
worsening numbness and tingling in both hands, history diabetes

EXAM:
CHEST  2 VIEW

[chest pa]
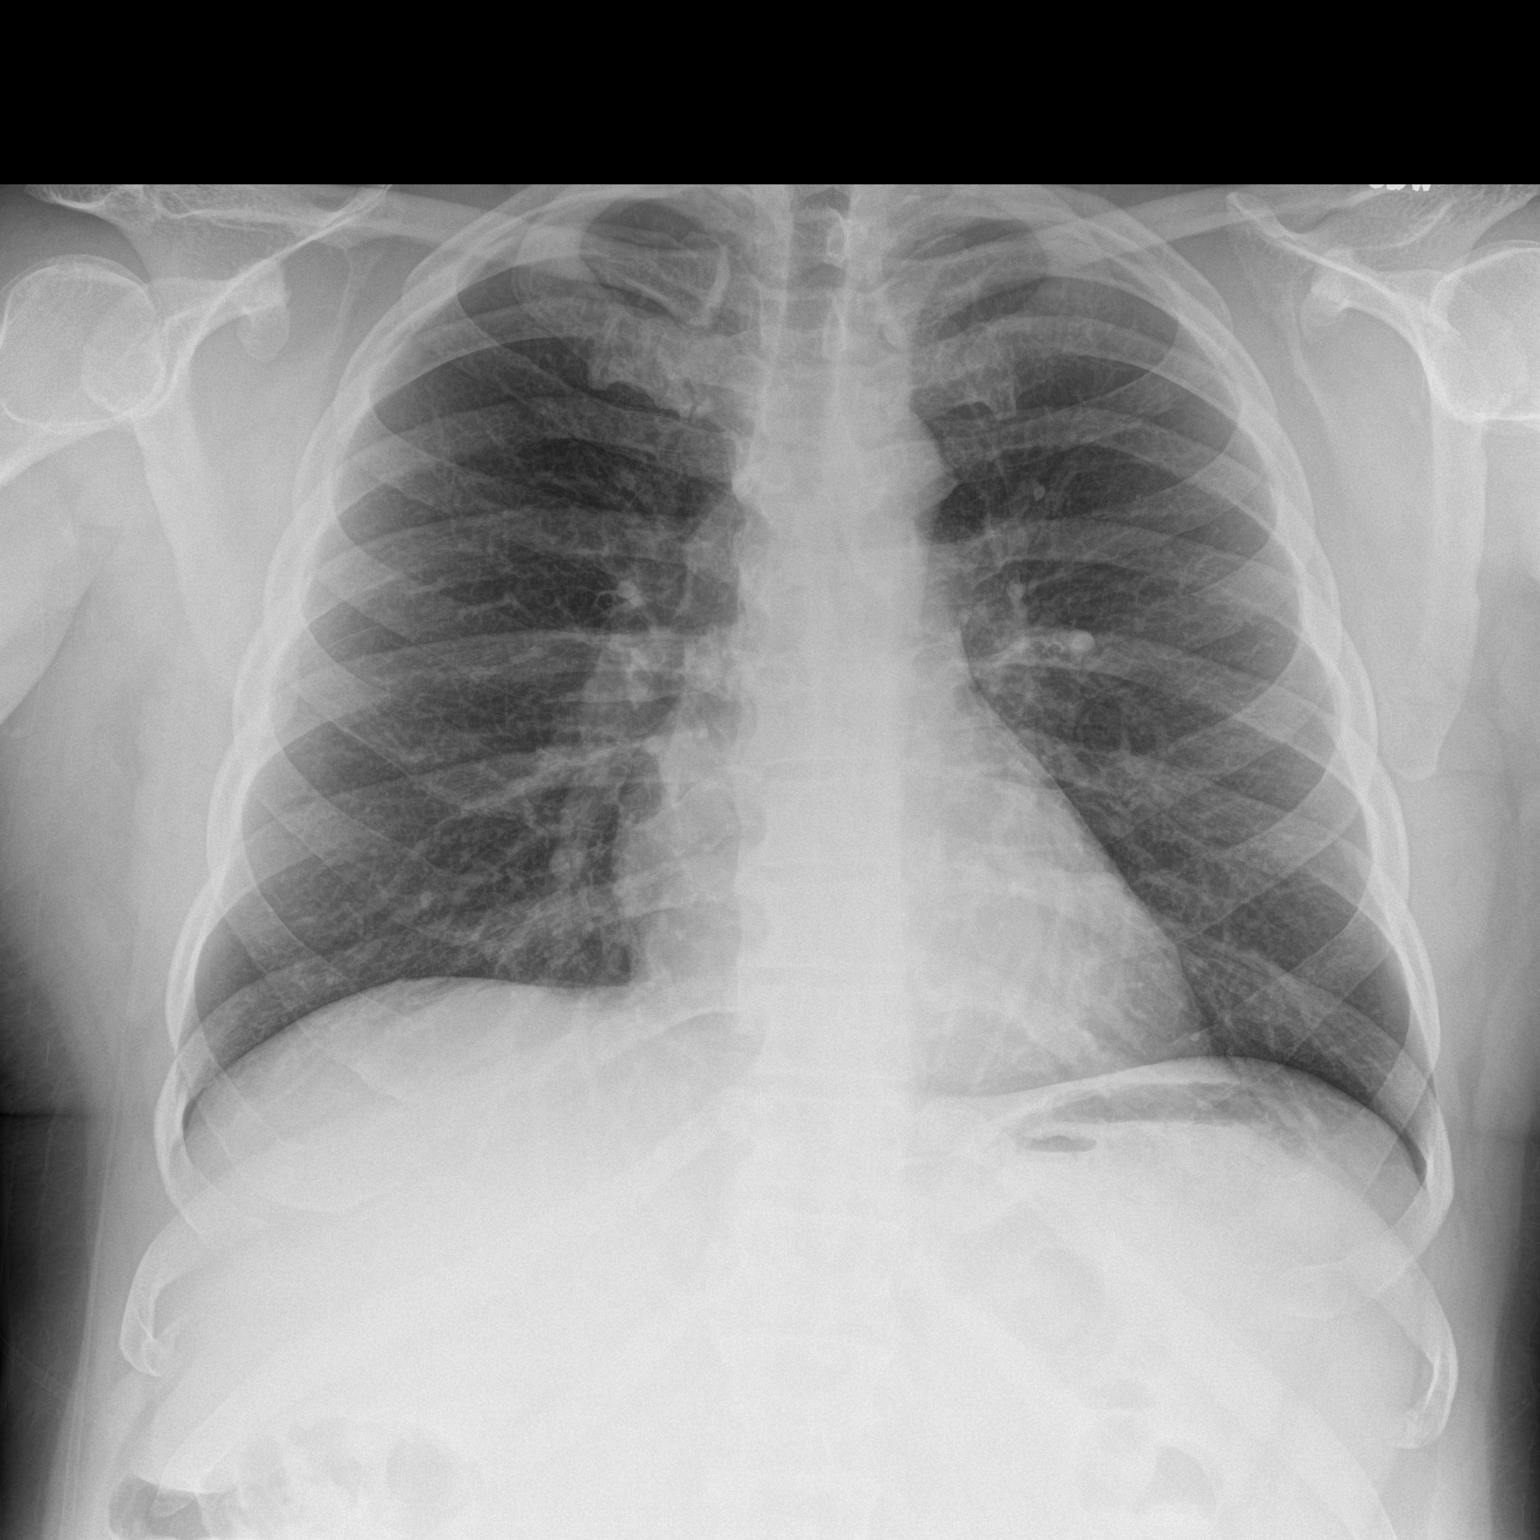

[chest lat]
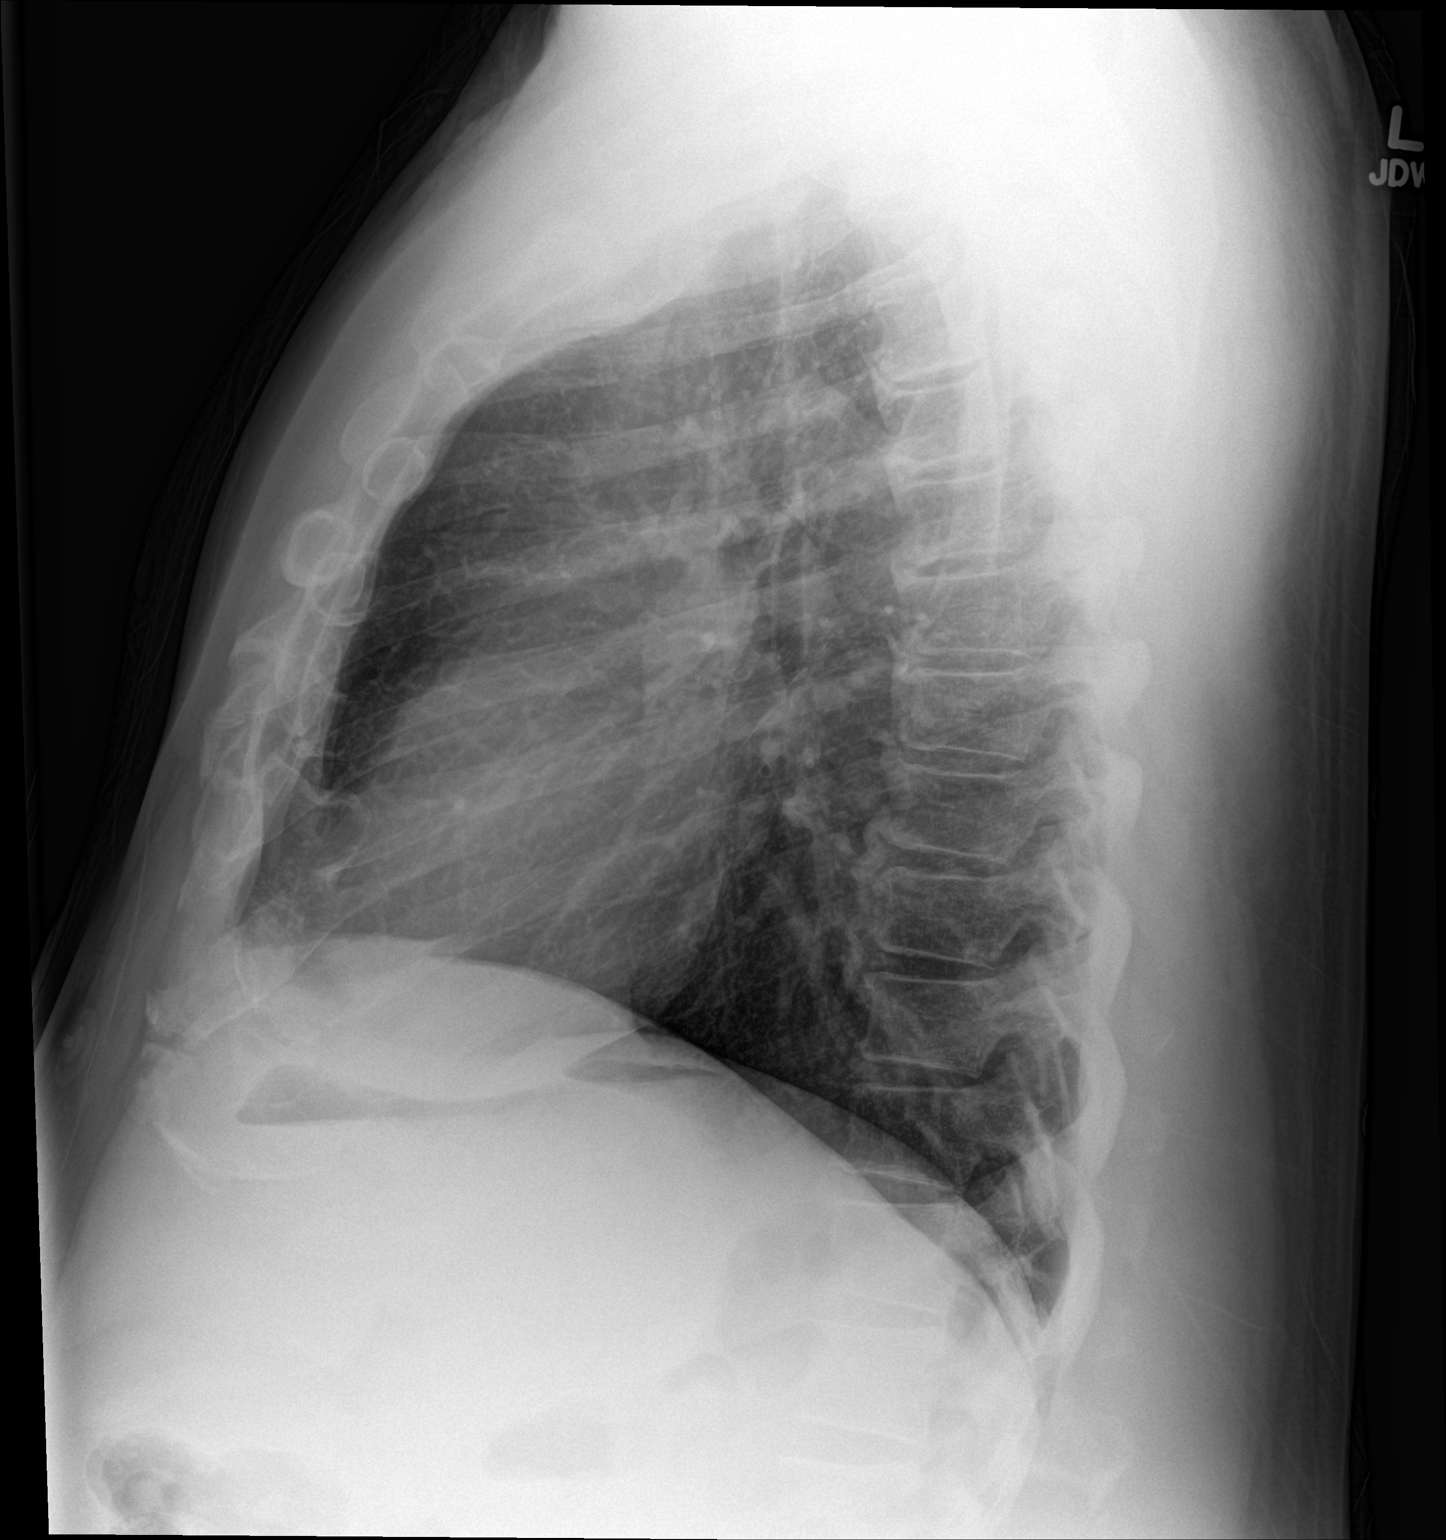

[2 of 2 positions shown; findings below may reference images not displayed]

FINDINGS: Normal heart size, mediastinal contours, and pulmonary vascularity.

Mild peribronchial thickening.

No pulmonary infiltrate, pleural effusion, or pneumothorax.

Bones unremarkable.
IMPRESSION: Mild bronchitic changes.

## 2016-10-30 DIAGNOSIS — R109 Unspecified abdominal pain: Secondary | ICD-10-CM | POA: Insufficient documentation

## 2016-10-30 DIAGNOSIS — K5903 Drug induced constipation: Secondary | ICD-10-CM | POA: Insufficient documentation

## 2017-04-13 ENCOUNTER — Ambulatory Visit (HOSPITAL_COMMUNITY)
Admission: EM | Admit: 2017-04-13 | Discharge: 2017-04-13 | Disposition: A | Discharge status: Home / Self Care | Payer: Worker's Compensation | Attending: Family Medicine | Admitting: Family Medicine

## 2017-04-13 ENCOUNTER — Encounter (HOSPITAL_COMMUNITY): Payer: Self-pay | Admitting: Emergency Medicine

## 2017-04-13 ENCOUNTER — Ambulatory Visit (INDEPENDENT_AMBULATORY_CARE_PROVIDER_SITE_OTHER): Payer: Worker's Compensation

## 2017-04-13 DIAGNOSIS — M545 Low back pain, unspecified: Secondary | ICD-10-CM

## 2017-04-13 DIAGNOSIS — W19XXXA Unspecified fall, initial encounter: Secondary | ICD-10-CM | POA: Diagnosis not present

## 2017-04-13 DIAGNOSIS — M533 Sacrococcygeal disorders, not elsewhere classified: Secondary | ICD-10-CM | POA: Diagnosis not present

## 2017-04-13 MED ORDER — PREDNISONE 20 MG PO TABS: 40.0000 mg | ORAL_TABLET | Freq: Every day | ORAL | 0 refills | Status: AC

## 2017-04-13 NOTE — ED Triage Notes (Signed)
Pt reports he fell today around 1345 today at work  C/o back pain and right leg pain  Pain increases w/activity... Hx of back hardware and surgery  A&O x4... NAD... Ambulatory

## 2017-04-13 NOTE — ED Provider Notes (Signed)
[facility]    CSN: [id number] Arrival date & time: 04/13/17  1520     History   Chief Complaint Chief Complaint  Patient presents with  . Fall    HPI Bryan Case. is a 51 y.o. male.   HPI Pt was at work and slipped on some steps while he was going down them. He fell back onto the steps onto his tailbone. He landed more on his R side than anything. He did not hit his head or lose consciousness. His R hip and lower back hurt. Denies numbness, tingling or weakness.   Past Medical History:  Diagnosis Date  . Diabetes mellitus   . Solitary kidney     History reviewed. No pertinent surgical history.   Home Medications    Prior to Admission medications   Medication Sig Start Date End Date Taking? Authorizing Provider  predniSONE (DELTASONE) 20 MG tablet Take 2 tablets (40 mg total) by mouth daily with breakfast. 04/13/17 04/18/17  Sharlene Dory, DO  testosterone cypionate (DEPOTESTOTERONE CYPIONATE) 200 MG/ML injection Inject 1 mL into the muscle every 21 ( twenty-one) days. 10/20/14   [provider]    Family History History reviewed. No pertinent family history.  Social History Social History  Substance Use Topics  . Smoking status: Never Smoker  . Smokeless tobacco: Never Used  . Alcohol use No     Allergies   Patient has no known allergies.   Review of Systems Review of Systems  Musculoskeletal: Positive for back pain.  Neurological: Negative for weakness.     Physical Exam Triage Vital Signs ED Triage Vitals  Enc Vitals Group     BP 04/13/17 1658 129/79     Pulse Rate 04/13/17 1658 76     Resp 04/13/17 1658 20     Temp 04/13/17 1658 98 F (36.7 C)     Temp Source 04/13/17 1658 Oral     SpO2 04/13/17 1658 97 %     Pain Score 04/13/17 1659 7   Updated Vital Signs BP 129/79 (BP Location: Right Arm)   Pulse 76   Temp 98 F (36.7 C) (Oral)   Resp 20   SpO2 97%   Physical Exam  Constitutional: He is oriented to  person, place, and time. He appears well-developed and well-nourished.  HENT:  Head: Atraumatic.  Pulmonary/Chest: No respiratory distress.  Musculoskeletal:  5/5 strength thoughout; TTP in lumbar paraspinal msk b/l, +L sided sacral TTP, mild TTP over greater troch on R, no TTP over isch tuberosities  Neurological: He is alert and oriented to person, place, and time. He displays normal reflexes. No sensory deficit. Coordination normal.  Skin: Skin is warm and dry.  Psychiatric: He has a normal mood and affect. Judgment normal.     UC Treatments / Results  Radiology Dg Sacrum/coccyx  Result Date: 04/13/2017 CLINICAL DATA:  Fall down stairs today.  Tailbone pain. EXAM: SACRUM AND COCCYX - 2+ VIEW COMPARISON:  01/07/2012 abdominal radiographs. FINDINGS: There is no evidence of fracture or other focal bone lesions. IMPRESSION: No fracture. Electronically Signed   By: Delbert Phenix M.D.   On: 04/13/2017 18:10    Procedures Procedures none  Initial Impression / Assessment and Plan / UC Course  I have reviewed the triage vital signs and the nursing notes.  Pertinent labs & imaging results that were available during my care of the patient were reviewed by me and considered in my medical decision making (see chart for details).  Pt presents after fall. Imaging unremarkable. Supportive care. Ice. Heat. Steroids as he has a solitary kidney. He is a recovering drug addict and would prefer to avoid addictive medicine and muscle relaxants have no worked. Home stretches and exercises also provided. Letter for work excusing next 2 days also given. F/u w pcp if no improvement. He voiced understanding and agreement to the plan.   Final Clinical Impressions(s) / UC Diagnoses   Final diagnoses:  Acute bilateral low back pain without sciatica  Sacral pain  Fall, initial encounter    New Prescriptions Discharge Medication List as of 04/13/2017  6:23 PM    START taking these medications   Details    predniSONE (DELTASONE) 20 MG tablet Take 2 tablets (40 mg total) by mouth daily with breakfast., Starting Sun 04/13/2017, Until Fri 04/18/2017, Normal         Controlled Substance Prescriptions [place] Controlled Substance Registry consulted? Not Applicable   Sharlene DoryWendling, Angeletta Goelz Paul, [place]DO 04/13/17 2220

## 2017-04-13 NOTE — Discharge Instructions (Signed)
Ice/cold pack over area for 10-15 min every 2-3 hours while awake. Heat (pad or rice pillow in microwave) over affected area, 10-15 minutes every 2-3 hours while awake.  OK to take Tylenol 1000 mg (2 extra strength tabs) or 975 mg (3 regular strength tabs) every 6 hours as needed. EXERCISES  RANGE OF MOTION (ROM) AND STRETCHING EXERCISES - Low Back Sprain Most people with lower back pain will find that their symptoms get worse with excessive bending forward (flexion) or arching at the lower back (extension). The exercises that will help resolve your symptoms will focus on the opposite motion.  Your physician, physical therapist or athletic trainer will help you determine which exercises will be most helpful to resolve your lower back pain. Do not complete any exercises without first consulting with your caregiver. Discontinue any exercises which make your symptoms worse, until you speak to your caregiver. If you have pain, numbness or tingling which travels down into your buttocks, leg or foot, the goal of the therapy is for these symptoms to move closer to your back and eventually resolve. Sometimes, these leg symptoms will get better, but your lower back pain may worsen. This is often an indication of progress in your rehabilitation. Be very alert to any changes in your symptoms and the activities in which you participated in the 24 hours prior to the change. Sharing this information with your caregiver will allow him or her to most efficiently treat your condition. These exercises may help you when beginning to rehabilitate your injury. Your symptoms may resolve with or without further involvement from your physician, physical therapist or athletic trainer. While completing these exercises, remember:  Restoring tissue flexibility helps normal motion to return to the joints. This allows healthier, less painful movement and activity. An effective stretch should be held for at least 30 seconds. A stretch  should never be painful. You should only feel a gentle lengthening or release in the stretched tissue. FLEXION RANGE OF MOTION AND STRETCHING EXERCISES:  STRETCH - Flexion, Single Knee to Chest  Lie on a firm bed or floor with both legs extended in front of you. Keeping one leg in contact with the floor, bring your opposite knee to your chest. Hold your leg in place by either grabbing behind your thigh or at your knee. Pull until you feel a gentle stretch in your low back. Hold 15-20 seconds. Slowly release your grasp and repeat the exercise with the opposite side. Repeat 2 times. Complete this exercise 1-2 times per day.   STRETCH - Flexion, Double Knee to Chest Lie on a firm bed or floor with both legs extended in front of you. Keeping one leg in contact with the floor, bring your opposite knee to your chest. Tense your stomach muscles to support your back and then lift your other knee to your chest. Hold your legs in place by either grabbing behind your thighs or at your knees. Pull both knees toward your chest until you feel a gentle stretch in your low back. Hold 15-20 seconds. Tense your stomach muscles and slowly return one leg at a time to the floor. Repeat 2 times. Complete this exercise 1-2 times per day.   STRETCH - Low Trunk Rotation Lie on a firm bed or floor. Keeping your legs in front of you, bend your knees so they are both pointed toward the ceiling and your feet are flat on the floor. Extend your arms out to the side. This will stabilize your upper  body by keeping your shoulders in contact with the floor. Gently and slowly drop both knees together to one side until you feel a gentle stretch in your low back. Hold for 15-20 seconds. Tense your stomach muscles to support your lower back as you bring your knees back to the starting position. Repeat the exercise to the other side. Repeat 2 times. Complete this exercise 1-2 times per day  EXTENSION RANGE OF MOTION AND FLEXIBILITY  EXERCISES:  STRETCH - Extension, Prone on Elbows  Lie on your stomach on the floor, a bed will be too soft. Place your palms about shoulder width apart and at the height of your head. Place your elbows under your shoulders. If this is too painful, stack pillows under your chest. Allow your body to relax so that your hips drop lower and make contact more completely with the floor. Hold this position for 15-20 seconds. Slowly return to lying flat on the floor. Repeat 2 times. Complete this exercise 1-2 times per day.   RANGE OF MOTION - Extension, Prone Press Ups Lie on your stomach on the floor, a bed will be too soft. Place your palms about shoulder width apart and at the height of your head. Keeping your back as relaxed as possible, slowly straighten your elbows while keeping your hips on the floor. You may adjust the placement of your hands to maximize your comfort. As you gain motion, your hands will come more underneath your shoulders. Hold this position 15-20 seconds. Slowly return to lying flat on the floor. Repeat 2 times. Complete this exercise 1-2 times per day.   RANGE OF MOTION- Quadruped, Neutral Spine  Assume a hands and knees position on a firm surface. Keep your hands under your shoulders and your knees under your hips. You may place padding under your knees for comfort. Drop your head and point your tailbone toward the ground below you. This will round out your lower back like an angry cat. Hold this position for 15-20 seconds. Slowly lift your head and release your tail bone so that your back sags into a large arch, like an old horse. Hold this position for 15-20 seconds. Repeat this until you feel limber in your low back. Now, find your "sweet spot." This will be the most comfortable position somewhere between the two previous positions. This is your neutral spine. Once you have found this position, tense your stomach muscles to support your low back. Hold this position for  15-20 seconds. Repeat 2 times. Complete this exercise 1-2 times per day.  STRENGTHENING EXERCISES - Low Back Sprain These exercises may help you when beginning to rehabilitate your injury. These exercises should be done near your "sweet spot." This is the neutral, low-back arch, somewhere between fully rounded and fully arched, that is your least painful position. When performed in this safe range of motion, these exercises can be used for people who have either a flexion or extension based injury. These exercises may resolve your symptoms with or without further involvement from your physician, physical therapist or athletic trainer. While completing these exercises, remember:  Muscles can gain both the endurance and the strength needed for everyday activities through controlled exercises. Complete these exercises as instructed by your physician, physical therapist or athletic trainer. Increase the resistance and repetitions only as guided. You may experience muscle soreness or fatigue, but the pain or discomfort you are trying to eliminate should never worsen during these exercises. If this pain does worsen, stop and make  certain you are following the directions exactly. If the pain is still present after adjustments, discontinue the exercise until you can discuss the trouble with your caregiver.  STRENGTHENING - Deep Abdominals, Pelvic Tilt  Lie on a firm bed or floor. Keeping your legs in front of you, bend your knees so they are both pointed toward the ceiling and your feet are flat on the floor. Tense your lower abdominal muscles to press your low back into the floor. This motion will rotate your pelvis so that your tail bone is scooping upwards rather than pointing at your feet or into the floor. With a gentle tension and even breathing, hold this position for 10-15 seconds. Repeat 2 times. Complete this exercise 1 time per day.   STRENGTHENING - Abdominals, Crunches  Lie on a firm bed or  floor. Keeping your legs in front of you, bend your knees so they are both pointed toward the ceiling and your feet are flat on the floor. Cross your arms over your chest. Slightly tip your chin down without bending your neck. Tense your abdominals and slowly lift your trunk high enough to just clear your shoulder blades. Lifting higher can put excessive stress on the lower back and does not further strengthen your abdominal muscles. Control your return to the starting position. Repeat 2 times. Complete this exercise once every 1-2 days.   STRENGTHENING - Quadruped, Opposite UE/LE Lift  Assume a hands and knees position on a firm surface. Keep your hands under your shoulders and your knees under your hips. You may place padding under your knees for comfort. Find your neutral spine and gently tense your abdominal muscles so that you can maintain this position. Your shoulders and hips should form a rectangle that is parallel with the floor and is not twisted. Keeping your trunk steady, lift your right hand no higher than your shoulder and then your left leg no higher than your hip. Make sure you are not holding your breath. Hold this position for 15-20 seconds. Continuing to keep your abdominal muscles tense and your back steady, slowly return to your starting position. Repeat with the opposite arm and leg. Repeat 2 times. Complete this exercise once every 1-2 days.   STRENGTHENING - Abdominals and Quadriceps, Straight Leg Raise  Lie on a firm bed or floor with both legs extended in front of you. Keeping one leg in contact with the floor, bend the other knee so that your foot can rest flat on the floor. Find your neutral spine, and tense your abdominal muscles to maintain your spinal position throughout the exercise. Slowly lift your straight leg off the floor about 6 inches for a count of 15, making sure to not hold your breath. Still keeping your neutral spine, slowly lower your leg all the way to  the floor. Repeat this exercise with each leg 2 times. Complete this exercise once every 1-2 days. POSTURE AND BODY MECHANICS CONSIDERATIONS - Low Back Sprain Keeping correct posture when sitting, standing or completing your activities will reduce the stress put on different body tissues, allowing injured tissues a chance to heal and limiting painful experiences. The following are general guidelines for improved posture. Your physician or physical therapist will provide you with any instructions specific to your needs. While reading these guidelines, remember: The exercises prescribed by your provider will help you have the flexibility and strength to maintain correct postures. The correct posture provides the best environment for your joints to work. All of your  joints have less wear and tear when properly supported by a spine with good posture. This means you will experience a healthier, less painful body. Correct posture must be practiced with all of your activities, especially prolonged sitting and standing. Correct posture is as important when doing repetitive low-stress activities (typing) as it is when doing a single heavy-load activity (lifting).  RESTING POSITIONS Consider which positions are most painful for you when choosing a resting position. If you have pain with flexion-based activities (sitting, bending, stooping, squatting), choose a position that allows you to rest in a less flexed posture. You would want to avoid curling into a fetal position on your side. If your pain worsens with extension-based activities (prolonged standing, working overhead), avoid resting in an extended position such as sleeping on your stomach. Most people will find more comfort when they rest with their spine in a more neutral position, neither too rounded nor too arched. Lying on a non-sagging bed on your side with a pillow between your knees, or on your back with a pillow under your knees will often provide some  relief. Keep in mind, being in any one position for a prolonged period of time, no matter how correct your posture, can still lead to stiffness. PROPER SITTING POSTURE In order to minimize stress and discomfort on your spine, you must sit with correct posture. Sitting with good posture should be effortless for a healthy body. Returning to good posture is a gradual process. Many people can work toward this most comfortably by using various supports until they have the flexibility and strength to maintain this posture on their own. When sitting with proper posture, your ears will fall over your shoulders and your shoulders will fall over your hips. You should use the back of the chair to support your upper back. Your lower back will be in a neutral position, just slightly arched. You may place a small pillow or folded towel at the base of your lower back for  support.  When working at a desk, create an environment that supports good, upright posture. Without extra support, muscles tire, which leads to excessive strain on joints and other tissues. Keep these recommendations in mind:  CHAIR: A chair should be able to slide under your desk when your back makes contact with the back of the chair. This allows you to work closely. The chair's height should allow your eyes to be level with the upper part of your monitor and your hands to be slightly lower than your elbows.  BODY POSITION Your feet should make contact with the floor. If this is not possible, use a foot rest. Keep your ears over your shoulders. This will reduce stress on your neck and low back.  INCORRECT SITTING POSTURES  If you are feeling tired and unable to assume a healthy sitting posture, do not slouch or slump. This puts excessive strain on your back tissues, causing more damage and pain. Healthier options include: Using more support, like a lumbar pillow. Switching tasks to something that requires you to be upright or  walking. Talking a brief walk. Lying down to rest in a neutral-spine position.  PROLONGED STANDING WHILE SLIGHTLY LEANING FORWARD  When completing a task that requires you to lean forward while standing in one place for a long time, place either foot up on a stationary 2-4 inch high object to help maintain the best posture. When both feet are on the ground, the lower back tends to lose its slight inward  curve. If this curve flattens (or becomes too large), then the back and your other joints will experience too much stress, tire more quickly, and can cause pain.  CORRECT STANDING POSTURES Proper standing posture should be assumed with all daily activities, even if they only take a few moments, like when brushing your teeth. As in sitting, your ears should fall over your shoulders and your shoulders should fall over your hips. You should keep a slight tension in your abdominal muscles to brace your spine. Your tailbone should point down to the ground, not behind your body, resulting in an over-extended swayback posture.   INCORRECT STANDING POSTURES  Common incorrect standing postures include a forward head, locked knees and/or an excessive swayback. WALKING Walk with an upright posture. Your ears, shoulders and hips should all line-up.  PROLONGED ACTIVITY IN A FLEXED POSITION When completing a task that requires you to bend forward at your waist or lean over a low surface, try to find a way to stabilize 3 out of 4 of your limbs. You can place a hand or elbow on your thigh or rest a knee on the surface you are reaching across. This will provide you more stability, so that your muscles do not tire as quickly. By keeping your knees relaxed, or slightly bent, you will also reduce stress across your lower back. CORRECT LIFTING TECHNIQUES  DO : Assume a wide stance. This will provide you more stability and the opportunity to get as close as possible to the object which you are lifting. Tense your  abdominals to brace your spine. Bend at the knees and hips. Keeping your back locked in a neutral-spine position, lift using your leg muscles. Lift with your legs, keeping your back straight. Test the weight of unknown objects before attempting to lift them. Try to keep your elbows locked down at your sides in order get the best strength from your shoulders when carrying an object.   Always ask for help when lifting heavy or awkward objects. INCORRECT LIFTING TECHNIQUES DO NOT:  Lock your knees when lifting, even if it is a small object. Bend and twist. Pivot at your feet or move your feet when needing to change directions. Assume that you can safely pick up even a paperclip without proper posture.

## 2017-07-31 ENCOUNTER — Other Ambulatory Visit: Payer: Self-pay

## 2017-07-31 ENCOUNTER — Ambulatory Visit (HOSPITAL_COMMUNITY)
Admission: EM | Admit: 2017-07-31 | Discharge: 2017-07-31 | Disposition: A | Discharge status: Home / Self Care | Payer: BLUE CROSS/BLUE SHIELD | Attending: Family Medicine | Admitting: Family Medicine

## 2017-07-31 ENCOUNTER — Encounter (HOSPITAL_COMMUNITY): Payer: Self-pay | Admitting: Emergency Medicine

## 2017-07-31 DIAGNOSIS — R109 Unspecified abdominal pain: Secondary | ICD-10-CM | POA: Insufficient documentation

## 2017-07-31 DIAGNOSIS — E119 Type 2 diabetes mellitus without complications: Secondary | ICD-10-CM | POA: Insufficient documentation

## 2017-07-31 LAB — POCT URINALYSIS DIP (DEVICE)
Bilirubin Urine: NEGATIVE
Glucose, UA: NEGATIVE mg/dL
HGB URINE DIPSTICK: NEGATIVE
KETONES UR: NEGATIVE mg/dL
Leukocytes, UA: NEGATIVE
Nitrite: NEGATIVE
PH: 6.5 (ref 5.0–8.0)
PROTEIN: NEGATIVE mg/dL
Urobilinogen, UA: 0.2 mg/dL (ref 0.0–1.0)

## 2017-07-31 MED ORDER — VALACYCLOVIR HCL 1 G PO TABS: 1000.0000 mg | ORAL_TABLET | Freq: Three times a day (TID) | ORAL | 0 refills | Status: AC

## 2017-07-31 NOTE — Discharge Instructions (Signed)
Pain in side may be shingles. You may have had chicken pox without realizing you had it. You may still break out in rash- do not scratch at it. Urine was clear today.   Please take valtrex three times a day for 7 days.  Please follow up with primary doctor for persistent symptoms.   Please return sooner if symptoms worsening, or you develop fever, shortness of breath, chest pain, abdominal pain, nausea vomiting, urinary symptoms.

## 2017-07-31 NOTE — ED Provider Notes (Signed)
[facility]    CSN: [id number] Arrival date & time: 07/31/17  1410     History   Chief Complaint Chief Complaint  Patient presents with  . Flank Pain    right    HPI Bryan LawmanJames Handley Jr. is a 51 y.o. male with solitary kidney and diabetes presenting with right flank and back pain for 1 day. Describes a sharp stabbing pain that began yesterday while he was eating lunch. Does not recall any injury, increased in activity, twisting motion. Worsens with movement, breathing. Has chronic back pain that wraps around entire back but this is different. Denies urinary symptoms- dysuria, hematuria, frequency. Denies respiratory symptoms- congestion, cough, shortness of breath, rhinorrhea. Has an occasional cough, but nothing increased from his normal. No abdominal pain- no nausea, vomiting, diarrhea. Increased frequency of bowels, no change in consistency. No fever, fatigue.   HPI  Past Medical History:  Diagnosis Date  . Diabetes mellitus   . Solitary kidney     There are no active problems to display for this patient.   History reviewed. No pertinent surgical history.     Home Medications    Prior to Admission medications   Medication Sig Start Date End Date Taking? Authorizing Provider  testosterone cypionate (DEPOTESTOTERONE CYPIONATE) 200 MG/ML injection Inject 1 mL into the muscle every 21 ( twenty-one) days. 10/20/14   [provider]  valACYclovir (VALTREX) 1000 MG tablet Take 1 tablet (1,000 mg total) by mouth 3 (three) times daily for 7 days. 07/31/17 08/07/17  Lydiah Pong, Junius CreamerHallie C, PA-C    Family History History reviewed. No pertinent family history.  Social History Social History   Tobacco Use  . Smoking status: Never Smoker  . Smokeless tobacco: Never Used  Substance Use Topics  . Alcohol use: No  . Drug use: No     Allergies   Patient has no known allergies.   Review of Systems Review of Systems  All other systems reviewed and are  negative.    Physical Exam Triage Vital Signs ED Triage Vitals  Enc Vitals Group     BP 07/31/17 1429 127/84     Pulse Rate 07/31/17 1429 75     Resp --      Temp 07/31/17 1429 98.2 F (36.8 C)     Temp Source 07/31/17 1429 Oral     SpO2 07/31/17 1429 97 %     Weight --      Height --      Head Circumference --      Peak Flow --      Pain Score 07/31/17 1427 8     Pain Loc --      Pain Edu? --      Excl. in GC? --    No data found.  Updated Vital Signs BP 127/84 (BP Location: Right Arm)   Pulse 75   Temp 98.2 F (36.8 C) (Oral)   SpO2 97%    Physical Exam  Constitutional: He is oriented to person, place, and time. He appears well-developed and well-nourished.  HENT:  Head: Normocephalic and atraumatic.  Eyes: Conjunctivae are normal.  Neck: Normal range of motion. Neck supple.  Cardiovascular: Normal rate and regular rhythm.  No murmur heard. Pulmonary/Chest: Effort normal and breath sounds normal. No respiratory distress.  Mild expiratory wheeze on right side  Abdominal: Soft. He exhibits no distension and no mass. There is no tenderness. There is no rebound, no guarding, no CVA tenderness, no tenderness at McBurney's point  and negative Murphy's sign.  overweight  Musculoskeletal: He exhibits tenderness. He exhibits no edema.       Cervical back: Normal. He exhibits no tenderness and no bony tenderness.       Thoracic back: Normal. He exhibits no tenderness and no bony tenderness.       Lumbar back: Normal. He exhibits no tenderness and no bony tenderness.  Extreme tenderness to light palpation in T 11 dermatome of flank/back, does not extend onto abdomen. No erythema or rash.  Neurological: He is alert and oriented to person, place, and time.  Skin: Skin is warm and dry.  No rash noted  Psychiatric: He has a normal mood and affect.  Nursing note and vitals reviewed.    UC Treatments / Results  Labs (all labs ordered are listed, but only abnormal results  are displayed) Labs Reviewed  URINE CULTURE  POCT URINALYSIS DIP (DEVICE)    EKG  EKG Interpretation None       Radiology No results found.  Procedures Procedures (including critical care time)  Medications Ordered in UC Medications - No data to display   Initial Impression / Assessment and Plan / UC Course  I have reviewed the triage vital signs and the nursing notes.  Pertinent labs & imaging results that were available during my care of the patient were reviewed by me and considered in my medical decision making (see chart for details).      Patient experiencing early shingles vs. muscular strain. Given distribution of pain, acute onset in nature, and hypersensitivity of exam will treat for shingles with valtrex. Side effects reviewed with patient.  Solitary kidney, unable to take NSAIDs, DM- would like to avoid steroids. UA negative. Unlikely kidney stone/UTI. Follow up with primary doctor if not improving. Discussed return precautions. Patient verbalized understanding and is agreeable with plan.  Discussed with Dr. Milus GlazierLauenstein.   Final Clinical Impressions(s) / UC Diagnoses   Final diagnoses:  Right flank pain    ED Discharge Orders        Ordered    valACYclovir (VALTREX) 1000 MG tablet  3 times daily     07/31/17 1514       Controlled Substance Prescriptions [place] Controlled Substance Registry consulted? Not Applicable   Lew DawesWieters, Zaley Talley C, PA-C 07/31/17 [address]    Garrie Elenes, KotlikHallie C, [place] JerseyPA-C 07/31/17 1536

## 2017-07-31 NOTE — ED Triage Notes (Signed)
Pt reports right flank pain and pain across his the center of his back that started last night.

## 2017-08-02 LAB — URINE CULTURE: Culture: NO GROWTH

## 2017-09-11 LAB — CBC AND DIFFERENTIAL
HCT: 45 (ref 41–53)
HEMOGLOBIN: 15.2 (ref 13.5–17.5)
Platelets: 256 (ref 150–399)
WBC: 9.9

## 2017-10-13 LAB — LIPID PANEL
CHOLESTEROL: 197 (ref 0–200)
HDL: 40 (ref 35–70)
LDL CALC: 123
Triglycerides: 172 — AB (ref 40–160)

## 2017-10-13 LAB — BASIC METABOLIC PANEL
BUN: 18 (ref 4–21)
CREATININE: 0.9 (ref 0.6–1.3)
Glucose: 126
Potassium: 4.3 (ref 3.4–5.3)
Sodium: 139 (ref 137–147)

## 2017-10-13 LAB — HEPATIC FUNCTION PANEL
ALK PHOS: 93 (ref 25–125)
ALT: 42 — AB (ref 10–40)
AST: 22 (ref 14–40)
Bilirubin, Total: 0.2

## 2017-10-16 LAB — HEMOGLOBIN A1C: HEMOGLOBIN A1C: 7.2

## 2017-12-19 ENCOUNTER — Encounter: Payer: Self-pay | Admitting: Nurse Practitioner

## 2017-12-19 DIAGNOSIS — M1990 Unspecified osteoarthritis, unspecified site: Secondary | ICD-10-CM | POA: Insufficient documentation

## 2017-12-19 DIAGNOSIS — R5383 Other fatigue: Secondary | ICD-10-CM

## 2017-12-19 DIAGNOSIS — N529 Male erectile dysfunction, unspecified: Secondary | ICD-10-CM | POA: Insufficient documentation

## 2017-12-19 DIAGNOSIS — G4700 Insomnia, unspecified: Secondary | ICD-10-CM | POA: Insufficient documentation

## 2017-12-19 DIAGNOSIS — R5381 Other malaise: Secondary | ICD-10-CM | POA: Insufficient documentation

## 2017-12-19 DIAGNOSIS — R7989 Other specified abnormal findings of blood chemistry: Secondary | ICD-10-CM | POA: Insufficient documentation

## 2017-12-19 DIAGNOSIS — J302 Other seasonal allergic rhinitis: Secondary | ICD-10-CM | POA: Insufficient documentation

## 2017-12-19 DIAGNOSIS — Q600 Renal agenesis, unilateral: Secondary | ICD-10-CM | POA: Insufficient documentation

## 2017-12-22 ENCOUNTER — Encounter: Payer: Self-pay | Admitting: Nurse Practitioner

## 2017-12-22 ENCOUNTER — Ambulatory Visit (INDEPENDENT_AMBULATORY_CARE_PROVIDER_SITE_OTHER): Payer: 59 | Admitting: Nurse Practitioner

## 2017-12-22 VITALS — BP 128/80 | HR 68 | Ht 67.0 in | Wt 244.2 lb

## 2017-12-22 DIAGNOSIS — J0140 Acute pansinusitis, unspecified: Secondary | ICD-10-CM | POA: Diagnosis not present

## 2017-12-22 DIAGNOSIS — G4700 Insomnia, unspecified: Secondary | ICD-10-CM | POA: Diagnosis not present

## 2017-12-22 DIAGNOSIS — J302 Other seasonal allergic rhinitis: Secondary | ICD-10-CM

## 2017-12-22 DIAGNOSIS — E119 Type 2 diabetes mellitus without complications: Secondary | ICD-10-CM

## 2017-12-22 DIAGNOSIS — M47814 Spondylosis without myelopathy or radiculopathy, thoracic region: Secondary | ICD-10-CM

## 2017-12-22 DIAGNOSIS — R7989 Other specified abnormal findings of blood chemistry: Secondary | ICD-10-CM | POA: Diagnosis not present

## 2017-12-22 DIAGNOSIS — H6123 Impacted cerumen, bilateral: Secondary | ICD-10-CM | POA: Diagnosis not present

## 2017-12-22 DIAGNOSIS — E785 Hyperlipidemia, unspecified: Secondary | ICD-10-CM | POA: Diagnosis not present

## 2017-12-22 MED ORDER — AMOXICILLIN-POT CLAVULANATE 875-125 MG PO TABS: 1.0000 | ORAL_TABLET | Freq: Two times a day (BID) | ORAL | 0 refills | Status: DC

## 2017-12-22 NOTE — Patient Instructions (Addendum)
To start augmentin twice daily until complete netipot twice daily  To use saline throughout the day to help with symptoms.  To use OTC generic Zyrtec or Claritin daily for allergies  Push fluids   Melatonin 3 mg by mouth daily at bedtime for sleep- take at the same time every night.   Cognitive behavioral therapy for anxiety and insomnia.   Follow up 6 week for physical with fasting blood work prior to visit.    Sinusitis, Adult Sinusitis is soreness and inflammation of your sinuses. Sinuses are hollow spaces in the bones around your face. Your sinuses are located:  Around your eyes.  In the middle of your forehead.  Behind your nose.  In your cheekbones.  Your sinuses and nasal passages are lined with a stringy fluid (mucus). Mucus normally drains out of your sinuses. When your nasal tissues become inflamed or swollen, the mucus can become trapped or blocked so air cannot flow through your sinuses. This allows bacteria, viruses, and funguses to grow, which leads to infection. Sinusitis can develop quickly and last for 7?10 days (acute) or for more than 12 weeks (chronic). Sinusitis often develops after a cold. What are the causes? This condition is caused by anything that creates swelling in the sinuses or stops mucus from draining, including:  Allergies.  Asthma.  Bacterial or viral infection.  Abnormally shaped bones between the nasal passages.  Nasal growths that contain mucus (nasal polyps).  Narrow sinus openings.  Pollutants, such as chemicals or irritants in the air.  A foreign object stuck in the nose.  A fungal infection. This is rare.  What increases the risk? The following factors may make you more likely to develop this condition:  Having allergies or asthma.  Having had a recent cold or respiratory tract infection.  Having structural deformities or blockages in your nose or sinuses.  Having a weak immune system.  Doing a lot of swimming or  diving.  Overusing nasal sprays.  Smoking.  What are the signs or symptoms? The main symptoms of this condition are pain and a feeling of pressure around the affected sinuses. Other symptoms include:  Upper toothache.  Earache.  Headache.  Bad breath.  Decreased sense of smell and taste.  A cough that may get worse at night.  Fatigue.  Fever.  Thick drainage from your nose. The drainage is often green and it may contain pus (purulent).  Stuffy nose or congestion.  Postnasal drip. This is when extra mucus collects in the throat or back of the nose.  Swelling and warmth over the affected sinuses.  Sore throat.  Sensitivity to light.  How is this diagnosed? This condition is diagnosed based on symptoms, a medical history, and a physical exam. To find out if your condition is acute or chronic, your health care provider may:  Look in your nose for signs of nasal polyps.  Tap over the affected sinus to check for signs of infection.  View the inside of your sinuses using an imaging device that has a light attached (endoscope).  If your health care provider suspects that you have chronic sinusitis, you may also:  Be tested for allergies.  Have a sample of mucus taken from your nose (nasal culture) and checked for bacteria.  Have a mucus sample examined to see if your sinusitis is related to an allergy.  If your sinusitis does not respond to treatment and it lasts longer than 8 weeks, you may have an MRI or CT  scan to check your sinuses. These scans also help to determine how severe your infection is. In rare cases, a bone biopsy may be done to rule out more serious types of fungal sinus disease. How is this treated? Treatment for sinusitis depends on the cause and whether your condition is chronic or acute. If a virus is causing your sinusitis, your symptoms will go away on their own within 10 days. You may be given medicines to relieve your symptoms,  including:  Topical nasal decongestants. They shrink swollen nasal passages and let mucus drain from your sinuses.  Antihistamines. These drugs block inflammation that is triggered by allergies. This can help to ease swelling in your nose and sinuses.  Topical nasal corticosteroids. These are nasal sprays that ease inflammation and swelling in your nose and sinuses.  Nasal saline washes. These rinses can help to get rid of thick mucus in your nose.  If your condition is caused by bacteria, you will be given an antibiotic medicine. If your condition is caused by a fungus, you will be given an antifungal medicine. Surgery may be needed to correct underlying conditions, such as narrow nasal passages. Surgery may also be needed to remove polyps. Follow these instructions at home: Medicines  Take, use, or apply over-the-counter and prescription medicines only as told by your health care provider. These may include nasal sprays.  If you were prescribed an antibiotic medicine, take it as told by your health care provider. Do not stop taking the antibiotic even if you start to feel better. Hydrate and Humidify  Drink enough water to keep your urine clear or pale yellow. Staying hydrated will help to thin your mucus.  Use a cool mist humidifier to keep the humidity level in your home above 50%.  Inhale steam for 10-15 minutes, 3-4 times a day or as told by your health care provider. You can do this in the bathroom while a hot shower is running.  Limit your exposure to cool or dry air. Rest  Rest as much as possible.  Sleep with your head raised (elevated).  Make sure to get enough sleep each night. General instructions  Apply a warm, moist washcloth to your face 3-4 times a day or as told by your health care provider. This will help with discomfort.  Wash your hands often with soap and water to reduce your exposure to viruses and other germs. If soap and water are not available, use hand  sanitizer.  Do not smoke. Avoid being around people who are smoking (secondhand smoke).  Keep all follow-up visits as told by your health care provider. This is important. Contact a health care provider if:  You have a fever.  Your symptoms get worse.  Your symptoms do not improve within 10 days. Get help right away if:  You have a severe headache.  You have persistent vomiting.  You have pain or swelling around your face or eyes.  You have vision problems.  You develop confusion.  Your neck is stiff.  You have trouble breathing. This information is not intended to replace advice given to you by your health care provider. Make sure you discuss any questions you have with your health care provider. Document Released: 07/29/2005 Document Revised: 03/24/2016 Document Reviewed: 05/24/2015 Elsevier Interactive Patient Education  2018 [employer].   Insomnia Insomnia is a sleep disorder that makes it difficult to fall asleep or to stay asleep. Insomnia can cause tiredness (fatigue), low energy, difficulty concentrating, mood swings, and  poor performance at work or school. There are three different ways to classify insomnia:  Difficulty falling asleep.  Difficulty staying asleep.  Waking up too early in the morning.  Any type of insomnia can be long-term (chronic) or short-term (acute). Both are common. Short-term insomnia usually lasts for three months or less. Chronic insomnia occurs at least three times a week for longer than three months. What are the causes? Insomnia may be caused by another condition, situation, or substance, such as:  Anxiety.  Certain medicines.  Gastroesophageal reflux disease (GERD) or other gastrointestinal conditions.  Asthma or other breathing conditions.  Restless legs syndrome, sleep apnea, or other sleep disorders.  Chronic pain.  Menopause. This may include hot flashes.  Stroke.  Abuse of alcohol, tobacco, or illegal  drugs.  Depression.  Caffeine.  Neurological disorders, such as Alzheimer disease.  An overactive thyroid (hyperthyroidism).  The cause of insomnia may not be known. What increases the risk? Risk factors for insomnia include:  Gender. Women are more commonly affected than men.  Age. Insomnia is more common as you get older.  Stress. This may involve your professional or personal life.  Income. Insomnia is more common in people with lower income.  Lack of exercise.  Irregular work schedule or night shifts.  Traveling between different time zones.  What are the signs or symptoms? If you have insomnia, trouble falling asleep or trouble staying asleep is the main symptom. This may lead to other symptoms, such as:  Feeling fatigued.  Feeling nervous about going to sleep.  Not feeling rested in the morning.  Having trouble concentrating.  Feeling irritable, anxious, or depressed.  How is this treated? Treatment for insomnia depends on the cause. If your insomnia is caused by an underlying condition, treatment will focus on addressing the condition. Treatment may also include:  Medicines to help you sleep.  Counseling or therapy.  Lifestyle adjustments.  Follow these instructions at home:  Take medicines only as directed by your health care provider.  Keep regular sleeping and waking hours. Avoid naps.  Keep a sleep diary to help you and your health care provider figure out what could be causing your insomnia. Include: ? When you sleep. ? When you wake up during the night. ? How well you sleep. ? How rested you feel the next day. ? Any side effects of medicines you are taking. ? What you eat and drink.  Make your bedroom a comfortable place where it is easy to fall asleep: ? Put up shades or special blackout curtains to block light from outside. ? Use a white noise machine to block noise. ? Keep the temperature cool.  Exercise regularly as directed by  your health care provider. Avoid exercising right before bedtime.  Use relaxation techniques to manage stress. Ask your health care provider to suggest some techniques that may work well for you. These may include: ? Breathing exercises. ? Routines to release muscle tension. ? Visualizing peaceful scenes.  Cut back on alcohol, caffeinated beverages, and cigarettes, especially close to bedtime. These can disrupt your sleep.  Do not overeat or eat spicy foods right before bedtime. This can lead to digestive discomfort that can make it hard for you to sleep.  Limit screen use before bedtime. This includes: ? Watching TV. ? Using your smartphone, tablet, and computer.  Stick to a routine. This can help you fall asleep faster. Try to do a quiet activity, brush your teeth, and go to bed at the  same time each night.  Get out of bed if you are still awake after 15 minutes of trying to sleep. Keep the lights down, but try reading or doing a quiet activity. When you feel sleepy, go back to bed.  Make sure that you drive carefully. Avoid driving if you feel very sleepy.  Keep all follow-up appointments as directed by your health care provider. This is important. Contact a health care provider if:  You are tired throughout the day or have trouble in your daily routine due to sleepiness.  You continue to have sleep problems or your sleep problems get worse. Get help right away if:  You have serious thoughts about hurting yourself or someone else. This information is not intended to replace advice given to you by your health care provider. Make sure you discuss any questions you have with your health care provider. Document Released: 07/26/2000 Document Revised: 12/29/2015 Document Reviewed: 04/29/2014 Elsevier Interactive Patient Education  [employer].

## 2017-12-22 NOTE — Progress Notes (Signed)
Careteam: Patient Care Team: Eartha Inch, MD as PCP - General (Family Medicine)  Advanced Directive information Does Patient Have a Medical Advance Directive?: No, Would patient like information on creating a medical advance directive?: Yes (MAU/Ambulatory/Procedural Areas - Information given)  Allergies  Allergen Reactions  . Anesthesia S-I-40 [Propofol]     Hard to wake up    Chief Complaint  Patient presents with  . Medical Management of Chronic Issues    Pt is being seen to establish care. Pt is now out of network with previous provider at [facility].      HPI: Patient is a 52 y.o. male seen in the office today to establish care and routine follow up. Previously with Dr Cyndia Bent but now under difference insurance due to change of job and therefore had to change PCP due to Dr Cyndia Bent being out of network.  Unsure when last physical was.   Hyperlipidemia- started zetia last month for elevated cholesterol. Had a hard time covering it but once he got his new insurance was able to get it. Does not do any diet modifications for cholesterol  Goes to gym once weekly.   Diabetes- previously on metformin but has not been on for over a year. Attempts to eat better but does not "diet"   OSA- currently on CPAP- wears every night, "can't sleep without it"  Was born with one kidney  Hay fever- feels like his ear is clogged and increased dizziness. Has to move slowly. Gets these symptoms frequently this time of year. usually symptoms are much worse around allergy symptoms. Takes an OTC [employer] allergy pill, unsure what the name is.   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 He was in [place] at the time. Went to inpatient program and then completed outpatient. Continues to go to [facility].   Had pain mid side  got epidural injections and now pain has resolved. Previously seeing neurosurgery for this, last visit in March 2019. Dr Laurian Brim wanted  to see him back but due to insurance change he could not follow up. Would like referral to neurosurgery in-network.   Very hesitant for vaccine. Would like to hold off on pneumonia vaccine at this time.   Review of Systems:  Review of Systems  Constitutional: Negative for chills, fever and weight loss.  HENT: Positive for congestion and tinnitus (with allergies). Negative for sinus pain and sore throat.   Eyes: Positive for blurred vision (has appt with eye doctor today).  Respiratory: Negative for cough and shortness of breath.   Cardiovascular: Negative for chest pain.  Gastrointestinal: Negative for abdominal pain, blood in stool, constipation, diarrhea, heartburn, nausea and vomiting.  Musculoskeletal: Positive for back pain, myalgias and neck pain. Negative for joint pain.  Neurological: Positive for dizziness and headaches.       Dizziness- started with increase in allergies. ~2 weeks ago. Last time he was dizzy it was due to sinus infection. Room is spinning when he lays down or gets up. No more dizziness once he gets up. Increased pressure in sinuses  Headache- sinus.   Endo/Heme/Allergies: Positive for environmental allergies.  Psychiatric/Behavioral: Substance abuse: history of substance abuse. The patient is nervous/anxious and has insomnia.     Past Medical History:  Diagnosis Date  . Congenital absence of one kidney   . Diabetes mellitus   . Hay fever   . Hyperlipidemia   . Sleep apnea   . Solitary kidney  Born with only one kidney   Past Surgical History:  Procedure Laterality Date  . arm surgery     left  . NECK SURGERY  2016   Social History:   reports that he has been smoking cigars.  He has never used smokeless tobacco. He reports that he does not drink alcohol or use drugs.  Family History  Problem Relation Age of Onset  . Heart disease Mother   . Diabetes Mother   . Seizures Father   . Renal Disease Father   . Cancer Maternal Grandmother   .  Arthritis Paternal Grandmother     Medications: Patient's Medications  New Prescriptions   No medications on file  Previous Medications   EZETIMIBE (ZETIA) 10 MG TABLET    Take 10 mg by mouth daily.  Modified Medications   No medications on file  Discontinued Medications   No medications on file     Physical Exam:  Vitals:   12/22/17 0901  BP: 128/80  Pulse: 68  SpO2: 96%  Weight: 244 lb 3.2 oz (110.8 kg)  Height:  (1.702 m)   Body mass index is 38.25 kg/m.  Physical Exam  Constitutional: He is oriented to person, place, and time. He appears well-developed and well-nourished. No distress.  HENT:  Head: Normocephalic and atraumatic.  Right Ear: External ear normal.  Left Ear: External ear normal.  Nose: Mucosal edema and rhinorrhea present.  Mouth/Throat: Mucous membranes are normal. Posterior oropharyngeal erythema present. No oropharyngeal exudate or posterior oropharyngeal edema.  Eyes: Pupils are equal, round, and reactive to light. Conjunctivae and EOM are normal.  Neck: Normal range of motion. Neck supple.  Cardiovascular: Normal rate, regular rhythm and normal heart sounds.  Pulmonary/Chest: Effort normal and breath sounds normal.  Abdominal: Soft. Bowel sounds are normal.  Musculoskeletal: He exhibits no edema or tenderness.  Neurological: He is alert and oriented to person, place, and time.  Skin: Skin is warm and dry. He is not diaphoretic.  Psychiatric: He has a normal mood and affect.    Labs reviewed: Basic Metabolic Panel: Recent Labs    10/13/17  NA 139  K 4.3  BUN 18  CREATININE 0.9   Liver Function Tests: Recent Labs    10/13/17  AST 22  ALT 42*  ALKPHOS 93   No results for input(s): LIPASE, AMYLASE in the last 8760 hours. No results for input(s): AMMONIA in the last 8760 hours. CBC: Recent Labs    09/11/17  WBC 9.9  HGB 15.2  HCT 45  PLT 256   Lipid Panel: Recent Labs    10/13/17  CHOL 197  HDL 40  LDLCALC 123  TRIG  172*   TSH: No results for input(s): TSH in the last 8760 hours. A1C: Lab Results  Component Value Date   HGBA1C 7.2 10/16/2017      Assessment/Plan 1. Thoracic spondylosis without myelopathy Previously following with neurosurgery with [facility], but now with change of insurance needing referral to [facility] provider. Previously getting injections to thoracolumbar paravertebral area.  - Ambulatory referral to Neurosurgery  2. Insomnia, unspecified type Melatonin 3 mg by mouth daily at bedtime for sleep- take at the same time every night.  Consider cognitive behavioral therapy insomnia.   3. Diabetes mellitus without complication (HCC) Recent A1c 7.2 continues to work on lifestyle modifications, not currently on medication.  - Hemoglobin A1c; Future  4. Hyperlipidemia, unspecified hyperlipidemia type -recently started zetia  - COMPLETE METABOLIC PANEL WITH GFR; Future - Lipid  Panel; Future  5. Acute non-recurrent pansinusitis - amoxicillin-clavulanate (AUGMENTIN) 875-125 MG tablet; Take 1 tablet by mouth 2 (two) times daily.  Dispense: 20 tablet; Refill: 0  6. Bilateral impacted cerumen -ear lavage done bilaterally, curette used bilaterally to remove wax, pt tolerated well   7. Seasonal allergies -can not tolerate flonase due to previous nasal procedures but has used netipot and tolerates this, to use netipot twice daily and plain saline PRN -to use genetic Claritin or zyrtec 10 mg OTC  8. Low testosterone - Ambulatory referral to Urology- previously on testosterone but reduced sperm count and pt and wife trying to conceive. May need to see fertility specialist however could not afford treatment options in the past.   Next appt: EV in 6 weeks with lab work prior to visit.  Janene Harvey. Biagio Borg  [facility] [phone]

## 2017-12-22 NOTE — Addendum Note (Signed)
Addended by: Chriss Driver on: 12/22/2017 01:30 PM   Modules accepted: Orders

## 2018-01-26 ENCOUNTER — Other Ambulatory Visit: Payer: 59

## 2018-01-26 DIAGNOSIS — E119 Type 2 diabetes mellitus without complications: Secondary | ICD-10-CM

## 2018-01-26 DIAGNOSIS — E785 Hyperlipidemia, unspecified: Secondary | ICD-10-CM

## 2018-01-27 ENCOUNTER — Telehealth: Payer: Self-pay

## 2018-01-27 MED ORDER — METFORMIN HCL 500 MG PO TABS: 1000.0000 mg | ORAL_TABLET | Freq: Two times a day (BID) | ORAL | 3 refills | Status: AC

## 2018-01-27 NOTE — Telephone Encounter (Signed)
Medication list has been updated and rx was sent to pharmacy. Patient verbalized understanding of directions.

## 2018-01-27 NOTE — Telephone Encounter (Signed)
-----   Message from Sharon SellerJessica K Eubanks, NP sent at 01/27/2018  9:11 AM EDT ----- A1c much worse, would recommended restarting metformin at this time. Would start with metformin 500 mg by mouth daily for 1 week then increase to twice daily (breakfast and supper) for 1 week then increase to metformin 1000 mg in the morning and 500 mg at supper x 1 week then to increase to 1000 mg by mouth twice daily.  Will need to have him start checking blood sugars twice daily- fasting and before supper.  LDL which is bad cholesterol is at 99, still above goal at 70 but triglycerides are elevated at 198 which is most likely due to uncontrolled diabetes.  Would could to work on diabetic diet, can plan a dietitian referral if he feels like education on nutrition would be beneficial.

## 2018-01-30 ENCOUNTER — Telehealth: Payer: Self-pay

## 2018-01-30 NOTE — Telephone Encounter (Signed)
Patient called stating he has not picked up his new metformin Rx and checked his sugar which read at a 358 after eating a small bowl of peanut butter cheerios, crackers and coffee with only creamer. Advised him to pick up Metformin asap and monitor and report any other readings that high.

## 2018-02-02 ENCOUNTER — Encounter: Payer: 59 | Admitting: Nurse Practitioner

## 2018-02-26 ENCOUNTER — Telehealth: Payer: Self-pay

## 2018-02-26 NOTE — Telephone Encounter (Signed)
I called patient to see about setting up a physical appointment with fasting labs prior. Pt was supposed to schedule appointment at the end of June but it was not scheduled.    I left a message for patient to call the office.

## 2018-03-02 NOTE — Telephone Encounter (Signed)
I left a message asking that patient call the office to schedule an appointment for a physical with fasting labs prior.   Pt also has a referral placed to [facility] from this office. I asked that patient call the office to discuss getting the appointment scheduled with their office as well.

## 2018-03-03 NOTE — Telephone Encounter (Signed)
Patient returned call regarding his appointments (physical and neurosurgery), he stated that he will call next week to schedule these due to having new insurance and he wants to make sure that they will cover these appointments.

## 2018-05-12 LAB — COMPLETE METABOLIC PANEL WITH GFR
AG RATIO: 1.5 (calc) (ref 1.0–2.5)
ALBUMIN MSPROF: 4.1 g/dL (ref 3.6–5.1)
ALKALINE PHOSPHATASE (APISO): 97 U/L (ref 40–115)
ALT: 35 U/L (ref 9–46)
AST: 16 U/L (ref 10–35)
BILIRUBIN TOTAL: 0.4 mg/dL (ref 0.2–1.2)
BUN: 13 mg/dL (ref 7–25)
CHLORIDE: 106 mmol/L (ref 98–110)
CO2: 27 mmol/L (ref 20–32)
CREATININE: 0.86 mg/dL (ref 0.70–1.33)
Calcium: 9.6 mg/dL (ref 8.6–10.3)
GFR, Est African American: 117 mL/min/{1.73_m2} (ref >=60)
GFR, Est Non African American: 101 mL/min/{1.73_m2} (ref >=60)
GLOBULIN: 2.7 g/dL (ref 1.9–3.7)
Glucose, Bld: 315 mg/dL — ABNORMAL HIGH (ref 65–99)
POTASSIUM: 4.6 mmol/L (ref 3.5–5.3)
SODIUM: 140 mmol/L (ref 135–146)
Total Protein: 6.8 g/dL (ref 6.1–8.1)

## 2018-05-12 LAB — LIPID PANEL
CHOL/HDL RATIO: 4.4 (calc) (ref <5.0)
CHOLESTEROL: 169 mg/dL (ref <200)
HDL: 38 mg/dL — AB (ref >40)
LDL CHOLESTEROL (CALC): 99 mg/dL
Non-HDL Cholesterol (Calc): 131 mg/dL (calc) — ABNORMAL HIGH (ref <130)
TRIGLYCERIDES: 198 mg/dL — AB (ref <150)

## 2018-05-12 LAB — HEMOGLOBIN A1C
Hgb A1c MFr Bld: 9.1 % of total Hgb — ABNORMAL HIGH (ref <5.7)
Mean Plasma Glucose: 214 (calc)
eAG (mmol/L): 11.9 (calc)

## 2018-06-18 ENCOUNTER — Encounter (HOSPITAL_COMMUNITY): Payer: Self-pay

## 2018-06-18 ENCOUNTER — Other Ambulatory Visit: Payer: Self-pay

## 2018-06-18 ENCOUNTER — Emergency Department (HOSPITAL_COMMUNITY): Payer: BLUE CROSS/BLUE SHIELD

## 2018-06-18 ENCOUNTER — Observation Stay (HOSPITAL_COMMUNITY)
Admission: EM | Admit: 2018-06-18 | Discharge: 2018-06-19 | Disposition: A | Discharge status: Home / Self Care | Payer: BLUE CROSS/BLUE SHIELD | Attending: Cardiology | Admitting: Cardiology

## 2018-06-18 DIAGNOSIS — F17290 Nicotine dependence, other tobacco product, uncomplicated: Secondary | ICD-10-CM | POA: Insufficient documentation

## 2018-06-18 DIAGNOSIS — E119 Type 2 diabetes mellitus without complications: Secondary | ICD-10-CM | POA: Diagnosis not present

## 2018-06-18 DIAGNOSIS — Z7982 Long term (current) use of aspirin: Secondary | ICD-10-CM | POA: Insufficient documentation

## 2018-06-18 DIAGNOSIS — G4733 Obstructive sleep apnea (adult) (pediatric): Secondary | ICD-10-CM | POA: Diagnosis not present

## 2018-06-18 DIAGNOSIS — Z8249 Family history of ischemic heart disease and other diseases of the circulatory system: Secondary | ICD-10-CM | POA: Diagnosis not present

## 2018-06-18 DIAGNOSIS — I200 Unstable angina: Secondary | ICD-10-CM | POA: Diagnosis present

## 2018-06-18 DIAGNOSIS — I10 Essential (primary) hypertension: Secondary | ICD-10-CM | POA: Insufficient documentation

## 2018-06-18 DIAGNOSIS — Z79899 Other long term (current) drug therapy: Secondary | ICD-10-CM | POA: Insufficient documentation

## 2018-06-18 DIAGNOSIS — Z7984 Long term (current) use of oral hypoglycemic drugs: Secondary | ICD-10-CM | POA: Insufficient documentation

## 2018-06-18 DIAGNOSIS — Z884 Allergy status to anesthetic agent status: Secondary | ICD-10-CM | POA: Insufficient documentation

## 2018-06-18 DIAGNOSIS — E785 Hyperlipidemia, unspecified: Secondary | ICD-10-CM | POA: Diagnosis not present

## 2018-06-18 LAB — GLUCOSE, CAPILLARY
GLUCOSE-CAPILLARY: 139 mg/dL — AB (ref 70–99)
Glucose-Capillary: 67 mg/dL — ABNORMAL LOW (ref 70–99)

## 2018-06-18 LAB — CBC
HCT: 43.6 % (ref 39.0–52.0)
HEMOGLOBIN: 13.8 g/dL (ref 13.0–17.0)
MCH: 27.9 pg (ref 26.0–34.0)
MCHC: 31.7 g/dL (ref 30.0–36.0)
MCV: 88.3 fL (ref 80.0–100.0)
Platelets: 247 10*3/uL (ref 150–400)
RBC: 4.94 MIL/uL (ref 4.22–5.81)
RDW: 12.2 % (ref 11.5–15.5)
WBC: 6.3 10*3/uL (ref 4.0–10.5)
nRBC: 0 % (ref 0.0–0.2)

## 2018-06-18 LAB — POCT I-STAT TROPONIN I: Troponin i, poc: 0 ng/mL (ref 0.00–0.08)

## 2018-06-18 LAB — TROPONIN I
Troponin I: <0.03 ng/mL (ref <0.03)
Troponin I: <0.03 ng/mL (ref <0.03)

## 2018-06-18 LAB — HEPARIN LEVEL (UNFRACTIONATED)

## 2018-06-18 LAB — BASIC METABOLIC PANEL
Anion gap: 7 (ref 5–15)
BUN: 17 mg/dL (ref 6–20)
CHLORIDE: 107 mmol/L (ref 98–111)
CO2: 28 mmol/L (ref 22–32)
Calcium: 8.8 mg/dL — ABNORMAL LOW (ref 8.9–10.3)
Creatinine, Ser: 0.9 mg/dL (ref 0.61–1.24)
GFR calc Af Amer: >60 mL/min (ref >60)
GFR calc non Af Amer: >60 mL/min (ref >60)
GLUCOSE: 89 mg/dL (ref 70–99)
Potassium: 4.1 mmol/L (ref 3.5–5.1)
Sodium: 142 mmol/L (ref 135–145)

## 2018-06-18 LAB — D-DIMER, QUANTITATIVE (NOT AT ARMC): D DIMER QUANT: 0.42 ug{FEU}/mL (ref 0.00–0.50)

## 2018-06-18 MED ORDER — INSULIN ASPART 100 UNIT/ML ~~LOC~~ SOLN
0.0000 [IU] | Freq: Three times a day (TID) | SUBCUTANEOUS | Status: DC
  Administered 2018-06-19: 2 [IU] via SUBCUTANEOUS

## 2018-06-18 MED ORDER — ASPIRIN 81 MG PO CHEW
324.0000 mg | CHEWABLE_TABLET | Freq: Once | ORAL | Status: AC
  Administered 2018-06-18: 324 mg via ORAL
  Filled 2018-06-18: qty 4

## 2018-06-18 MED ORDER — ACETAMINOPHEN 325 MG PO TABS: 650.0000 mg | ORAL_TABLET | ORAL | Status: DC | PRN

## 2018-06-18 MED ORDER — HEPARIN (PORCINE) 25000 UT/250ML-% IV SOLN
1600.0000 [IU]/h | INTRAVENOUS | Status: DC
  Administered 2018-06-18: 1100 [IU]/h via INTRAVENOUS
  Administered 2018-06-19: 1600 [IU]/h via INTRAVENOUS
  Filled 2018-06-18: qty 250

## 2018-06-18 MED ORDER — INSULIN ASPART 100 UNIT/ML ~~LOC~~ SOLN: 0.0000 [IU] | Freq: Every day | SUBCUTANEOUS | Status: DC

## 2018-06-18 MED ORDER — ASPIRIN EC 81 MG PO TBEC
81.0000 mg | DELAYED_RELEASE_TABLET | Freq: Every day | ORAL | Status: DC
  Administered 2018-06-19: 81 mg via ORAL
  Filled 2018-06-18: qty 1

## 2018-06-18 MED ORDER — LORATADINE 10 MG PO TABS
10.0000 mg | ORAL_TABLET | Freq: Every day | ORAL | Status: DC
  Administered 2018-06-19: 10 mg via ORAL
  Filled 2018-06-18: qty 1

## 2018-06-18 MED ORDER — HEPARIN (PORCINE) 25000 UT/250ML-% IV SOLN
1100.0000 [IU]/h | INTRAVENOUS | Status: DC
  Administered 2018-06-18: 1100 [IU]/h via INTRAVENOUS
  Filled 2018-06-18: qty 250

## 2018-06-18 MED ORDER — ONDANSETRON HCL 4 MG/2ML IJ SOLN: 4.0000 mg | Freq: Four times a day (QID) | INTRAMUSCULAR | Status: DC | PRN

## 2018-06-18 MED ORDER — NITROGLYCERIN 0.4 MG SL SUBL
0.4000 mg | SUBLINGUAL_TABLET | Freq: Once | SUBLINGUAL | Status: AC
  Administered 2018-06-18: 0.4 mg via SUBLINGUAL
  Filled 2018-06-18: qty 1

## 2018-06-18 MED ORDER — ATORVASTATIN CALCIUM 40 MG PO TABS
40.0000 mg | ORAL_TABLET | Freq: Every day | ORAL | Status: DC
  Filled 2018-06-18: qty 1

## 2018-06-18 MED ORDER — HEPARIN BOLUS VIA INFUSION
4000.0000 [IU] | Freq: Once | INTRAVENOUS | Status: AC
  Administered 2018-06-18: 4000 [IU] via INTRAVENOUS
  Filled 2018-06-18: qty 4000

## 2018-06-18 MED ORDER — NITROGLYCERIN 0.4 MG SL SUBL: 0.4000 mg | SUBLINGUAL_TABLET | SUBLINGUAL | Status: DC | PRN

## 2018-06-18 NOTE — ED Triage Notes (Signed)
Pt states that he was recently seen at his PCP for similar complaints. Pt states that he has had CP and SHOB for 1 week. Pt states that he was trying to get into a cardiologist, but has been unable to make an appointment. PCP told pt if pain got worse, to come to ED. Pt states that pain is worse with walking.

## 2018-06-18 NOTE — ED Notes (Signed)
ED TO INPATIENT HANDOFF REPORT  Name/Age/Gender Georga Kaufmann. 52 y.o. male  Code Status   Home/SNF/Other Home  Chief Complaint chest pain   short of breath   Level of Care/Admitting Diagnosis ED Disposition    ED Disposition Condition [facility]: [place] [100100]  Level of Care: Telemetry [5]  Diagnosis: Unstable angina [facility]) [[id number]]  Admitting Physician: Lelon Perla [[id number]]  Attending Physician: Lelon Perla [[id number]]  Estimated length of stay: past midnight tomorrow  Certification:: I certify this patient will need inpatient services for at least 2 midnights  PT Class (Do Not Modify): Inpatient [101]  PT Acc Code (Do Not Modify): Private [1]       Medical History Past Medical History:  Diagnosis Date  . Congenital absence of one kidney   . Diabetes mellitus   . Hay fever   . Hyperlipidemia   . Sleep apnea   . Solitary kidney    Born with only one kidney    Allergies Allergies  Allergen Reactions  . Anesthesia S-I-40 [Propofol] Other (See Comments)    Hard to wake up    IV Location/Drains/Wounds Patient Lines/Drains/Airways Status   Active Line/Drains/Airways    Name:   Placement date:   Placement time:   Site:   Days:   Peripheral IV 06/18/18 Left Antecubital   06/18/18    1320    Antecubital   less than 1          Labs/Imaging Results for orders placed or performed during the hospital encounter of 06/18/18 (from the past 48 hour(s))  POCT i-Stat troponin I     Status: None   Collection Time: 06/18/18 12:14 PM  Result Value Ref Range   Troponin i, poc 0.00 0.00 - 0.08 ng/mL   Comment 3            Comment: Due to the release kinetics of cTnI, a negative result within the first hours of the onset of symptoms does not rule out myocardial infarction with certainty. If myocardial infarction is still suspected, repeat the test at appropriate intervals.   Basic metabolic panel     Status: Abnormal    Collection Time: 06/18/18 12:16 PM  Result Value Ref Range   Sodium 142 135 - 145 mmol/L   Potassium 4.1 3.5 - 5.1 mmol/L   Chloride 107 98 - 111 mmol/L   CO2 28 22 - 32 mmol/L   Glucose, Bld 89 70 - 99 mg/dL   BUN 17 6 - 20 mg/dL   Creatinine, Ser 0.90 0.61 - 1.24 mg/dL   Calcium 8.8 (L) 8.9 - 10.3 mg/dL   GFR calc non Af Amer >60 >60 mL/min   GFR calc Af Amer >60 >60 mL/min    Comment: (NOTE) The eGFR has been calculated using the CKD EPI equation. This calculation has not been validated in all clinical situations. eGFR's persistently <60 mL/min signify possible Chronic Kidney Disease.    Anion gap 7 5 - 15    Comment: Performed at [facility], [place] [address], [place], [place] [address]  CBC     Status: None   Collection Time: 06/18/18 12:16 PM  Result Value Ref Range   WBC 6.3 4.0 - 10.5 K/uL   RBC 4.94 4.22 - 5.81 MIL/uL   Hemoglobin 13.8 13.0 - 17.0 g/dL   HCT 43.6 39.0 - 52.0 %   MCV 88.3 80.0 - 100.0 fL   MCH 27.9  26.0 - 34.0 pg   MCHC 31.7 30.0 - 36.0 g/dL   RDW 12.2 11.5 - 15.5 %   Platelets 247 150 - 400 K/uL   nRBC 0.0 0.0 - 0.2 %    Comment: Performed at [facility], [place] [address] [place], [place] [address]   Dg Chest 2 View  Result Date: 06/18/2018 CLINICAL DATA:  Chest pain shortness of breath for 1 week. EXAM: CHEST - 2 VIEW COMPARISON:  12/13/2014 FINDINGS: Cardiomediastinal silhouette is normal. Mediastinal contours appear intact. There is no evidence of focal airspace consolidation, pleural effusion or pneumothorax. Osseous structures are without acute abnormality. 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 Soft tissues are grossly normal. IMPRESSION: No active cardiopulmonary disease. Electronically Signed   By: Fidela Salisbury M.D.   On: 06/18/2018 11:43    Pending Labs Unresulted Labs (From admission, onward)    Start     Ordered   06/19/18 0500  CBC  Daily,   R      06/18/18 1302   06/18/18 2000  Heparin level (unfractionated)  Once-Timed,   R     06/18/18 1341          Vitals/Pain Today's Vitals   06/18/18 1315 06/18/18 1331 06/18/18 1430 06/18/18 1445  BP: 116/70 117/83 120/66 110/66  Pulse: 73 89 64 69  Resp: (!) 21 18 (!) 22 (!) 25  Temp:      TempSrc:      SpO2: 98% 98% 95% 98%  Weight:      Height:      PainSc:        Isolation Precautions No active isolations  Medications Medications  heparin bolus via infusion 4,000 Units (4,000 Units Intravenous Bolus from Bag 06/18/18 1332)    Followed by  heparin ADULT infusion 100 units/mL (25000 units/221m sodium chloride 0.45%) (1,100 Units/hr Intravenous New Bag/Given 06/18/18 1333)  aspirin chewable tablet 324 mg (324 mg Oral Given 06/18/18 1321)  nitroGLYCERIN (NITROSTAT) SL tablet 0.4 mg (0.4 mg Sublingual Given 06/18/18 1322)    Mobility walks

## 2018-06-18 NOTE — ED Notes (Signed)
Attempted to call reports 2x. Nurse stated she would call back when she can.

## 2018-06-18 NOTE — Progress Notes (Signed)
ANTICOAGULATION CONSULT NOTE - Follow-Up Consult  Pharmacy Consult for Heparin Indication: chest pain/ACS  Allergies  Allergen Reactions  . Anesthesia S-I-40 [Propofol] Other (See Comments)    Hard to wake up    Patient Measurements: Height: 5\' 7"  (170.2 cm) Weight: 240 lb (108.9 kg) IBW/kg (Calculated) : 66.1 HEPARIN DW (KG): 90.5   Vital Signs: Temp: 97.7 F (36.5 C) (11/07 1930) Temp Source: Oral (11/07 1930) BP: 111/57 (11/07 1930) Pulse Rate: 86 (11/07 1930)  Labs: Recent Labs    06/18/18 1216 06/18/18 1714 06/18/18 2019  HGB 13.8  --   --   HCT 43.6  --   --   PLT 247  --   --   HEPARINUNFRC  --   --  <0.10*  CREATININE 0.90  --   --   TROPONINI  --  <0.03  --     Estimated Creatinine Clearance: 114.3 mL/min (by C-G formula based on SCr of 0.9 mg/dL).   Medical History: Past Medical History:  Diagnosis Date  . Congenital absence of one kidney   . Diabetes mellitus   . Hay fever   . Hyperlipidemia   . Sleep apnea   . 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 heparin 1,100 Units/hr (06/18/18 1619)    Assessment: 52 yoM admitted with CP and started on heparin. EKG negative, troponins normal. Initial heparin level undetectable, no issues with infusion per RN.  Goal of Therapy:  Heparin level 0.3-0.7 units/ml Monitor platelets by anticoagulation protocol: Yes   Plan:  Heparin to 1400 units/hr Recheck heparin level at 0500 with morning labs   Fredonia Highland, PharmD, BCPS Clinical Pharmacist [phone] Please check AMION for all [facility] Pharmacy numbers 06/18/2018

## 2018-06-18 NOTE — Progress Notes (Signed)
ANTICOAGULATION CONSULT NOTE - Initial Consult  Pharmacy Consult for Heparin Indication: chest pain/ACS  Allergies  Allergen Reactions  . Anesthesia S-I-40 [Propofol] Other (See Comments)    Hard to wake up    Patient Measurements: Height: 5\' 7"  (170.2 cm) Weight: 240 lb (108.9 kg) IBW/kg (Calculated) : 66.1 HEPARIN DW (KG): 90.5   Vital Signs: Temp: 97.8 F (36.6 C) (11/07 1118) Temp Source: Oral (11/07 1118) BP: 124/77 (11/07 1242) Pulse Rate: 74 (11/07 1242)  Labs: Recent Labs    06/18/18 1216  HGB 13.8  HCT 43.6  PLT 247  CREATININE 0.90    Estimated Creatinine Clearance: 114.3 mL/min (by C-G formula based on SCr of 0.9 mg/dL).   Medical History: Past Medical History:  Diagnosis Date  . Congenital absence of one kidney   . Diabetes mellitus   . Hay fever   . Hyperlipidemia   . Sleep apnea   . Solitary kidney    Born with only one kidney    Medications: no anticoagulants PTA Infusions:    Assessment: 52 yo M with chest pain.  Troponin and EKG normal.  Pharmacy consulted to start heparin pending work-up for USAP.  CBC WNL.  No bleeding noted.   Goal of Therapy:  Heparin level 0.3-0.7 units/ml Monitor platelets by anticoagulation protocol: Yes   Plan:  Give 4000 units bolus x 1 Start heparin infusion at 1100 units/hr Check anti-Xa level in 6 hours and daily while on heparin Continue to monitor H&H and platelets  Annamarie Yamaguchi, [facility] 06/18/2018,12:56 PM

## 2018-06-18 NOTE — H&P (Addendum)
Cardiology Admission History and Physical:   Patient ID: Bryan Case. MRN: [id number]; DOB: 02/07/1966   Admission date: 06/18/2018  Primary Care Provider: Sharon Seller, NP Primary Cardiologist: New to [facility]; Dr. Jens Som Primary Electrophysiologist:  None   Chief Complaint:  Chest pain  Patient Profile:   Bryan Case. is a 52 y.o. male with HLD, DM type 2, OSA on CPAP, congenital absence of one kidney, former polysubstance abuse sober for 25 years, who presents with exertional chest pain.   History of Present Illness:   Bryan Case has been experiencing exertional chest pain and SOB for the past 2 weeks. He describes the pain as a pressure sensation which comes on with walking, last for 2-3 minutes at a time, and resolves with rest. He reports associated SOB, occasional diaphoresis, and nausea. He has noticed a decrease in the amount of activity needed to bring on symptoms. He saw his PCP for this complaint on 06/16/18 and was in the process of scheduling an outpatient appt with a cardiologist. He reported developing chest pressure shortly after waking this morning which was persistent, prompting him to present to the ED for further evaluation.   He has no prior cardiac history. He reported having sharp chest pain during a stressful time in his life, for which he underwent a stress test in 2009 which he states was normal. He denies any recurrence of chest pain since that time until 2 weeks ago. He denies any family history of MI but notes HTN, HLD, and DM in both mother and father. He reports his son was born with an enlarged heart and passed away at a young age. He denies ETOH or tobacco abuse. He has a former history of polysubstance abuse but has been clean for 25 years.   At the time of this evaluation he reports still having some chest pressure. He reported some relief of chest pain with SL nitro which brought his pain to a 3/10 from a 7/10 earlier today. 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 He denies recent changes in activity or muscle strain. He denies orthopnea, PND, LE edema, syncope, recent travel, melena, hematochezia, hematuria, fever, or cough.   ED course: initially hypertensive but normalized; otherwise VSS. Labs notable for electrolytes wnl, Cr 0.9, CBC wnl, Trop 0.00 x1. CXR without acute findings. EKG with isolated TWI in lead III; no STE/D. Patient was given ASA and SL nitro, started on a heparin gtt, and transferred to [facility] for further cardiac work-up.    Past Medical History:  Diagnosis Date  . Congenital absence of one kidney   . Diabetes mellitus   . Hay fever   . Hyperlipidemia   . Sleep apnea   . 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 arm surgery     left  . NECK SURGERY  2016     Medications Prior to Admission: Prior to Admission medications   Medication Sig Start Date End Date Taking? Authorizing Provider  cetirizine (ZYRTEC) 10 MG tablet Take 10 mg by mouth daily.   Yes [provider]  ezetimibe (ZETIA) 10 MG tablet Take 10 mg by mouth daily.   Yes [provider]  metFORMIN (GLUCOPHAGE) 500 MG tablet Take 2 tablets (1,000 mg total) by mouth 2 (two) times daily with a meal. 01/27/18  Yes Sharon Seller, NP  Multiple Vitamins-Minerals (MULTIVITAMIN GUMMIES MENS) CHEW Chew 1 each by mouth daily.   Yes [provider]  vardenafil (LEVITRA) 20 MG tablet Take 20 mg by mouth daily as needed for erectile dysfunction.  06/07/18  Yes [provider]  amoxicillin-clavulanate (AUGMENTIN) 875-125 MG tablet Take 1 tablet by mouth 2 (two) times daily. Patient not taking: Reported on 06/18/2018 12/22/17   Sharon Seller, NP     Allergies:    Allergies  Allergen Reactions  . Anesthesia S-I-40 [Propofol] Other (See Comments)    Hard to wake up    Social History:   Social  History   Socioeconomic History  . Marital status: Married    Spouse name: Not on file  . Number of children: Not on file  . Years of education: Not on file  . Highest education level: Not on file  Occupational History  . Not on file  Social Needs  . Financial resource strain: Not on file  . Food insecurity:    Worry: Not on file    Inability: Not on file  . Transportation needs:    Medical: Not on file    Non-medical: Not on file  Tobacco Use  . Smoking status: Current Some Day Smoker    Types: Cigars  . Smokeless tobacco: Never Used  . Tobacco comment: smokes cigars every few months  Substance and Sexual Activity  . Alcohol use: No  . Drug use: No  . Sexual activity: Not on file  Lifestyle  . Physical activity:    Days per week: 1 day    Minutes per session: 90 min  . Stress: Not on file  Relationships  . Social connections:    Talks on phone: Not on file    Gets together: Not on file    Attends religious service: Not on file    Active member of club or organization: Not on file    Attends meetings of clubs or organizations: Not on file    Relationship status: Not on file  . Intimate partner violence:    Fear of current or ex partner: Not on file    Emotionally abused: Not on file    Physically abused: Not on file    Forced sexual activity: Not on file  Other Topics Concern  . Not on file  Social History Narrative   Tobacco use, amount per day now:    Past tobacco use, amount per day: SOCIAL   How many years did you use tobacco: 5 YEARS   Alcohol use (drinks per week):NONE   Diet:YES PERIODICALLY   Do you drink/eat things with caffeine: YES   Marital status:  MARRIED                                What year were you married? 2017   Do you live in a house, apartment, assisted living, condo, trailer, etc.? HOUSE   Is it one or more stories? ONE   How many persons live in your home? 2   Do you have pets in your home?( please list) NO   Current or past  profession:FACILITIES MANAGER    Do you exercise? YES              Type and how often?1-2 TIMES PER WEEK   Do you have a living will? NO   Do you have a DNR form?    NO  If not, do you want to discuss one? NO   Do you have signed POA/HPOA forms?     NO                   If so, please bring to you appointment NO    Family History:  Denies family history of MI or cardiac stents The patient's family history includes Arthritis in his paternal grandmother; Cancer in his maternal grandmother; Diabetes in his mother and paternal grandmother; Heart disease in his mother and paternal grandmother; Hyperlipidemia in his mother and paternal grandmother; Hypertension in his mother and paternal grandmother; Renal Disease in his father; Seizures in his father.    ROS:  Please see the history of present illness.  All other ROS reviewed and negative.     Physical Exam/Data:   Vitals:   06/18/18 1118 06/18/18 1242 06/18/18 1315  BP: (!) 160/71 124/77 116/70  Pulse: 90 74 73  Resp: 16 18 (!) 21  Temp: 97.8 F (36.6 C)    TempSrc: Oral    SpO2: 98% 99% 98%  Weight: 108.9 kg    Height: 5\' 7"  (1.702 m)     No intake or output data in the 24 hours ending 06/18/18 1325 Filed Weights   06/18/18 1118  Weight: 108.9 kg   Body mass index is 37.59 kg/m.  General:  Well nourished, well developed, laying in bed in no acute distress HEENT: sclera anicteric Neck: no JVD Vascular: No carotid bruits; distal pulses 2+ bilaterally  Cardiac:  normal S1, S2; RRR; no murmurs, rubs, or gallops; + chest wall TTP Lungs:  clear to auscultation bilaterally, no wheezing, rhonchi or rales  Abd: soft, obese, nontender, no hepatomegaly  Ext: no edema Musculoskeletal:  No deformities, BUE and BLE strength normal and equal Skin: warm and dry  Neuro:  CNs 2-12 intact, no focal abnormalities noted Psych:  Normal affect    EKG:  sinus rhythm with isolated TWI in lead III, early repolarization;  otherwise no STE/D.  Relevant CV Studies: None available for review  Laboratory Data:  Chemistry Recent Labs  Lab 06/18/18 1216  NA 142  K 4.1  CL 107  CO2 28  GLUCOSE 89  BUN 17  CREATININE 0.90  CALCIUM 8.8*  GFRNONAA >60  GFRAA >60  ANIONGAP 7    No results for input(s): PROT, ALBUMIN, AST, ALT, ALKPHOS, BILITOT in the last 168 hours. Hematology Recent Labs  Lab 06/18/18 1216  WBC 6.3  RBC 4.94  HGB 13.8  HCT 43.6  MCV 88.3  MCH 27.9  MCHC 31.7  RDW 12.2  PLT 247   Cardiac EnzymesNo results for input(s): TROPONINI in the last 168 hours.  Recent Labs  Lab 06/18/18 1214  TROPIPOC 0.00    BNPNo results for input(s): BNP, PROBNP in the last 168 hours.  DDimer No results for input(s): DDIMER in the last 168 hours.  Radiology/Studies:  Dg Chest 2 View  Result Date: 06/18/2018 CLINICAL DATA:  Chest pain shortness of breath for 1 week. EXAM: CHEST - 2 VIEW COMPARISON:  12/13/2014 FINDINGS: Cardiomediastinal silhouette is normal. Mediastinal contours appear intact. There is no evidence of focal airspace consolidation, pleural effusion or pneumothorax. Osseous structures are without acute abnormality. 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 Soft tissues are grossly normal. IMPRESSION: No active cardiopulmonary disease. Electronically Signed   By: Ted Mcalpine M.D.   On: 06/18/2018 11:43    Assessment and Plan:   1. Unstable  angina: patient presented with progressively worsening exertional chest pain, now with episodes occurring at rest. Trop negative x1. EKG with sinus rhythm with isolated TWI in lead III, early repolarization; otherwise no STE/D. Risk factors for CAD include DM type 2, HLD, OSA, and obesity. He was given ASA and started on a heparin gtt in the ED at [facility]. Transferred to [facility] for [facility] evaluation.  - Continue heparin gtt per pharmacy for ACS - Will check HgbA1C and lipid panel for risk  stratification - Will check an echocardiogram to assess LV function - Continue to cycle troponins - if remains negatives, will plan for NST in AM. - NPO after MN  2. DM type 2: last A1C 6.2 04/2018. Home metformin on hold.  - Will check HgbA1C - Maintain on ISS   3. HLD: LDL 111 on zetia - Will check lipid panel  - Will start atorvastatin 40mg  daily  4. Congenital absence of one kidney: Cr 0.9 (baseline) - Continue to monitor Cr closely afterwards  5. OSA: compliant with CPAP - Continue CPAP   Severity of Illness: The appropriate patient status for this patient is OBSERVATION. Observation status is judged to be reasonable and necessary in order to provide the required intensity of service to ensure the patient's safety. The patient's presenting symptoms, physical exam findings, and initial radiographic and laboratory data in the context of their medical condition is felt to place them at decreased risk for further clinical deterioration. Furthermore, it is anticipated that the patient will be medically stable for discharge from the hospital within 2 midnights of admission. The following factors support the patient status of observation.   " The patient's presenting symptoms include chest pain. " The physical exam findings include benign cardiopulmonary exam. " The initial radiographic and laboratory data are Trop negative x1 and EKG non-ischemic.     For questions or updates, please contact [facility] Please consult [email or web address] for contact info under        Signed, Bryan Stallion, PA-C  06/18/2018 1:25 PM  As above, patient seen and examined.  Briefly he is a 52 year old male with past medical history of diabetes mellitus, hyperlipidemia, obstructive sleep apnea, congenital absence of one kidney, prior substance abuse but clean for 25 years with chest pain.  Over the past 2 weeks patient has had substernal chest pain described as a pressure.  It initially occurred with  activities and relieved with rest.  Occasional radiation to the shoulder.  He also has had dyspnea on exertion but no orthopnea, PND or pedal edema.  His chest pain became more severe recently.  It resumed at approximately 12 PM yesterday and has been continuous since that time.  It increases with inspiration.  He had nausea but no diaphoresis.  He presented to the emergency room and is admitted to further assess. On physical exam his chest pain is reproduced with palpation.  No recent travel or surgeries. Electrocardiogram shows sinus rhythm with no significant ST changes.  Initial troponin is normal.  1 chest pain-description of symptoms initially concerning.  However it has now been present for greater than 24 hours and electrocardiogram shows no ST changes and initial troponin normal.  It is also reproduced with palpating his chest.  There is also a pleuritic component though no risk factors for pulmonary embolus.  We will cycle enzymes.  If negative plan stress nuclear study for risk stratification.  Check echocardiogram for LV function.  We will also check d-dimer.  Will treat  with aspirin and heparin.  2 diabetes mellitus-follow CBGs.  3 hyperlipidemia-given diabetes mellitus we will treat with statin.  Add Lipitor.  Check lipids and liver 4 weeks following discharge.  Olga Millers, MD

## 2018-06-18 NOTE — ED Provider Notes (Addendum)
[facility]-EMERGENCY DEPT Provider Note   CSN: [id number] Arrival date & time: 06/18/18  1108     History   Chief Complaint Chief Complaint  Patient presents with  . Chest Pain  . Shortness of Breath    HPI Bryan Case. is a 52 y.o. male.  HPI Patient presents with chest pain over the last couple weeks.  Comes and goes and initially was coming on with exertion.  States if he would do a lot of walking at work he would start getting chest pain with shortness of breath.  Pain was pressure in his mid chest.  It would go away with rest.  States saw his PCP and was attempting ketamine to see [facility] cardiology but has not been able to get in yet.  Now pain has started coming on at rest.  It is dull in his mid chest right now.  Takes Levitra but has not had in the last few days. Past Medical History:  Diagnosis Date  . Congenital absence of one kidney   . Diabetes mellitus   . Hay fever   . Hyperlipidemia   . Sleep apnea   . 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 Unstable angina (HCC) 06/18/2018  . Congenital absence of one kidney 12/19/2017  . ED (erectile dysfunction) 12/19/2017  . Insomnia 12/19/2017  . Low testosterone 12/19/2017  . Osteoarthritis 12/19/2017  . Other malaise and fatigue 12/19/2017  . Seasonal allergic rhinitis 12/19/2017  . Drug-induced constipation 10/30/2016  . Flank pain 10/30/2016  . Hemorrhoids 01/24/2016  . Rectal fissure 01/24/2016  . Cervical stenosis of spine 01/11/2015  . Sleep apnea 01/11/2015  . Controlled type 2 diabetes mellitus without complication, without long-term current use of insulin (HCC) 08/19/2012  . Tongue, fissured 08/19/2012    Past Surgical History:  Procedure Laterality Date  . arm surgery     left  . NECK SURGERY  2016        Home Medications    Prior to Admission medications   Medication Sig Start Date  End Date Taking? Authorizing Provider  cetirizine (ZYRTEC) 10 MG tablet Take 10 mg by mouth daily.   Yes [provider]  ezetimibe (ZETIA) 10 MG tablet Take 10 mg by mouth daily.   Yes [provider]  metFORMIN (GLUCOPHAGE) 500 MG tablet Take 2 tablets (1,000 mg total) by mouth 2 (two) times daily with a meal. 01/27/18  Yes Eubanks, Janene Harvey, NP  Multiple Vitamins-Minerals (MULTIVITAMIN GUMMIES MENS) CHEW Chew 1 each by mouth daily.   Yes [provider]  vardenafil (LEVITRA) 20 MG tablet Take 20 mg by mouth daily as needed for erectile dysfunction.  06/07/18  Yes [provider]  amoxicillin-clavulanate (AUGMENTIN) 875-125 MG tablet Take 1 tablet by mouth 2 (two) times daily. Patient not taking: Reported on 06/18/2018 12/22/17   Sharon Seller, NP    Family History Family History  Problem Relation Age of Onset  . Heart disease Mother   . Diabetes Mother   . Hyperlipidemia Mother   . Hypertension Mother   . Seizures Father   . Renal Disease Father   . Cancer Maternal Grandmother        breast  . Arthritis Paternal Grandmother   . Diabetes Paternal Grandmother   . Heart disease Paternal Grandmother   . Hyperlipidemia Paternal Grandmother   . Hypertension  Paternal Grandmother     Social History Social History   Tobacco Use  . Smoking status: Current Some Day Smoker    Types: Cigars  . Smokeless tobacco: Never Used  . Tobacco comment: smokes cigars every few months  Substance Use Topics  . Alcohol use: No  . Drug use: No     Allergies   Anesthesia s-i-40 [propofol]   Review of Systems Review of Systems  Constitutional: Negative for appetite change.  HENT: Negative for congestion.   Respiratory: Positive for shortness of breath.   Cardiovascular: Positive for chest pain. Negative for leg swelling.  Gastrointestinal: Negative for abdominal pain.  Genitourinary: Negative for flank pain.  Musculoskeletal: Negative for back pain.    Skin: Negative for rash.  Neurological: Negative for weakness.  Psychiatric/Behavioral: Negative for confusion.     Physical Exam Updated Vital Signs BP 116/70   Pulse 73   Temp 97.8 F (36.6 C) (Oral)   Resp (!) 21   Ht 5\' 7"  (1.702 m)   Wt 108.9 kg   SpO2 98%   BMI 37.59 kg/m   Physical Exam  Constitutional: He appears well-developed.  HENT:  Head: Atraumatic.  Eyes: Pupils are equal, round, and reactive to light.  Neck: Neck supple.  Cardiovascular: Normal rate and regular rhythm.  Pulmonary/Chest: Effort normal and breath sounds normal.  Musculoskeletal:       Right lower leg: He exhibits no edema.       Left lower leg: He exhibits no edema.  Neurological: He is alert.  Skin: Skin is warm. Capillary refill takes less than 2 seconds.     ED Treatments / Results  Labs (all labs ordered are listed, but only abnormal results are displayed) Labs Reviewed  BASIC METABOLIC PANEL - Abnormal; Notable for the following components:      Result Value   Calcium 8.8 (*)    All other components within normal limits  CBC  I-STAT TROPONIN, ED  POCT I-STAT TROPONIN I    EKG EKG Interpretation  Date/Time:  Thursday June 18 2018 11:16:52 EST Ventricular Rate:  70 PR Interval:    QRS Duration: 106 QT Interval:  377 QTC Calculation: 407 R Axis:   77 Text Interpretation:  Sinus rhythm Confirmed by Benjiman Core [phone]) on 06/18/2018 12:48:29 PM   Radiology Dg Chest 2 View  Result Date: 06/18/2018 CLINICAL DATA:  Chest pain shortness of breath for 1 week. EXAM: CHEST - 2 VIEW COMPARISON:  12/13/2014 FINDINGS: Cardiomediastinal silhouette is normal. Mediastinal contours appear intact. There is no evidence of focal airspace consolidation, pleural effusion or pneumothorax. Osseous structures are without acute abnormality. 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 Soft tissues are grossly normal. IMPRESSION: No active cardiopulmonary  disease. Electronically Signed   By: Ted Mcalpine M.D.   On: 06/18/2018 11:43    Procedures Procedures (including critical care time)  Medications Ordered in ED Medications  heparin bolus via infusion 4,000 Units (has no administration in time range)    Followed by  heparin ADULT infusion 100 units/mL (25000 units/274mL sodium chloride 0.45%) (has no administration in time range)  aspirin chewable tablet 324 mg (324 mg Oral Given 06/18/18 1321)  nitroGLYCERIN (NITROSTAT) SL tablet 0.4 mg (0.4 mg Sublingual Given 06/18/18 1322)     Initial Impression / Assessment and Plan / ED Course  I have reviewed the triage vital signs and the nursing notes.  Pertinent labs & imaging results that were available during my care of  the patient were reviewed by me and considered in my medical decision making (see chart for details).     Patient with chest pain.  Pressure in his chest that comes on with exertion and goes away with rest.  Now started coming on while he is at rest.  X-ray reassuring.  EKG reassuring.  However the story is worrisome for an unstable angina.  Will start heparin and was given aspirin.  Has a unilateral kidney but has good kidney function.  Will give sublingual nitroglycerin to see if it helps with the pain.  Will admit to cardiology   CRITICAL CARE Performed by: Benjiman Core Total critical care time: 30 minutes Critical care time was exclusive of separately billable procedures and treating other patients. Critical care was necessary to treat or prevent imminent or life-threatening deterioration. Critical care was time spent personally by me on the following activities: development of treatment plan with patient and/or surrogate as well as nursing, discussions with consultants, evaluation of patient's response to treatment, examination of patient, obtaining history from patient or surrogate, ordering and performing treatments and interventions, ordering and review of  laboratory studies, ordering and review of radiographic studies, pulse oximetry and re-evaluation of patient's condition.   Discussed with Dr. Jens Som.  If they are available beds can come to [facility].  Discussed with bed controlled and there are beds being cleared right now.  I have put in admit orders will be seen by cardiology upon arrival.   Final Clinical Impressions(s) / ED Diagnoses   Final diagnoses:  Unstable angina [facility])    ED Discharge Orders    None       Benjiman Core, MD 06/18/18 1255    Benjiman Core, MD 06/18/18 1324

## 2018-06-19 ENCOUNTER — Observation Stay (HOSPITAL_COMMUNITY): Payer: Self-pay

## 2018-06-19 ENCOUNTER — Observation Stay (HOSPITAL_BASED_OUTPATIENT_CLINIC_OR_DEPARTMENT_OTHER): Payer: BLUE CROSS/BLUE SHIELD

## 2018-06-19 DIAGNOSIS — I5030 Unspecified diastolic (congestive) heart failure: Secondary | ICD-10-CM | POA: Diagnosis not present

## 2018-06-19 DIAGNOSIS — I10 Essential (primary) hypertension: Secondary | ICD-10-CM | POA: Diagnosis not present

## 2018-06-19 DIAGNOSIS — E119 Type 2 diabetes mellitus without complications: Secondary | ICD-10-CM | POA: Diagnosis not present

## 2018-06-19 DIAGNOSIS — R079 Chest pain, unspecified: Secondary | ICD-10-CM | POA: Diagnosis not present

## 2018-06-19 DIAGNOSIS — I200 Unstable angina: Secondary | ICD-10-CM | POA: Diagnosis not present

## 2018-06-19 DIAGNOSIS — E785 Hyperlipidemia, unspecified: Secondary | ICD-10-CM | POA: Diagnosis not present

## 2018-06-19 LAB — CBC
HEMATOCRIT: 42.3 % (ref 39.0–52.0)
Hemoglobin: 13.6 g/dL (ref 13.0–17.0)
MCH: 27.4 pg (ref 26.0–34.0)
MCHC: 32.2 g/dL (ref 30.0–36.0)
MCV: 85.1 fL (ref 80.0–100.0)
Platelets: 268 10*3/uL (ref 150–400)
RBC: 4.97 MIL/uL (ref 4.22–5.81)
RDW: 12.1 % (ref 11.5–15.5)
WBC: 6.7 10*3/uL (ref 4.0–10.5)
nRBC: 0 % (ref 0.0–0.2)

## 2018-06-19 LAB — HEPARIN LEVEL (UNFRACTIONATED)

## 2018-06-19 LAB — BASIC METABOLIC PANEL
Anion gap: 10 (ref 5–15)
BUN: 14 mg/dL (ref 6–20)
CALCIUM: 8.9 mg/dL (ref 8.9–10.3)
CO2: 25 mmol/L (ref 22–32)
CREATININE: 1.05 mg/dL (ref 0.61–1.24)
Chloride: 106 mmol/L (ref 98–111)
GFR calc non Af Amer: >60 mL/min (ref >60)
GLUCOSE: 93 mg/dL (ref 70–99)
Potassium: 3.4 mmol/L — ABNORMAL LOW (ref 3.5–5.1)
Sodium: 141 mmol/L (ref 135–145)

## 2018-06-19 LAB — HEMOGLOBIN A1C
HEMOGLOBIN A1C: 5.8 % — AB (ref 4.8–5.6)
Mean Plasma Glucose: 119.76 mg/dL

## 2018-06-19 LAB — NM MYOCAR MULTI W/SPECT W/WALL MOTION / EF
CSEPPHR: 107 {beats}/min
Estimated workload: 1 METS
Exercise duration (min): 7 min
Exercise duration (sec): 23 s
Rest HR: 65 {beats}/min

## 2018-06-19 LAB — LIPID PANEL
CHOLESTEROL: 198 mg/dL (ref 0–200)
HDL: 33 mg/dL — AB (ref >40)
LDL CALC: 108 mg/dL — AB (ref 0–99)
TRIGLYCERIDES: 286 mg/dL — AB (ref <150)
Total CHOL/HDL Ratio: 6 RATIO
VLDL: 57 mg/dL — ABNORMAL HIGH (ref 0–40)

## 2018-06-19 LAB — HIV ANTIBODY (ROUTINE TESTING W REFLEX): HIV Screen 4th Generation wRfx: NONREACTIVE

## 2018-06-19 LAB — GLUCOSE, CAPILLARY
GLUCOSE-CAPILLARY: 122 mg/dL — AB (ref 70–99)
GLUCOSE-CAPILLARY: 137 mg/dL — AB (ref 70–99)

## 2018-06-19 LAB — ECHOCARDIOGRAM COMPLETE
Height: 67 in
Weight: 3851.88 oz

## 2018-06-19 LAB — MRSA PCR SCREENING: MRSA BY PCR: NEGATIVE

## 2018-06-19 MED ORDER — ATORVASTATIN CALCIUM 40 MG PO TABS: 40.0000 mg | ORAL_TABLET | Freq: Every day | ORAL | 6 refills | Status: DC

## 2018-06-19 MED ORDER — ALUM & MAG HYDROXIDE-SIMETH 200-200-20 MG/5ML PO SUSP
30.0000 mL | Freq: Once | ORAL | Status: AC
  Administered 2018-06-19: 30 mL via ORAL
  Filled 2018-06-19: qty 30

## 2018-06-19 MED ORDER — TECHNETIUM TC 99M TETROFOSMIN IV KIT
10.0000 | PACK | Freq: Once | INTRAVENOUS | Status: AC | PRN
  Administered 2018-06-19: 10 via INTRAVENOUS

## 2018-06-19 MED ORDER — REGADENOSON 0.4 MG/5ML IV SOLN
INTRAVENOUS | Status: AC
  Administered 2018-06-19: 0.4 mg via INTRAVENOUS
  Filled 2018-06-19: qty 5

## 2018-06-19 MED ORDER — REGADENOSON 0.4 MG/5ML IV SOLN
0.4000 mg | Freq: Once | INTRAVENOUS | Status: AC
  Administered 2018-06-19: 0.4 mg via INTRAVENOUS

## 2018-06-19 MED ORDER — ATORVASTATIN CALCIUM 40 MG PO TABS: 40.0000 mg | ORAL_TABLET | Freq: Every day | ORAL | 2 refills | Status: DC

## 2018-06-19 MED ORDER — TECHNETIUM TC 99M TETROFOSMIN IV KIT
30.0000 | PACK | Freq: Once | INTRAVENOUS | Status: AC | PRN
  Administered 2018-06-19: 30 via INTRAVENOUS

## 2018-06-19 MED ORDER — ASPIRIN 81 MG PO TBEC: 81.0000 mg | DELAYED_RELEASE_TABLET | Freq: Every day | ORAL | Status: DC

## 2018-06-19 MED ORDER — HEPARIN BOLUS VIA INFUSION
2000.0000 [IU] | Freq: Once | INTRAVENOUS | Status: AC
  Administered 2018-06-19: 2000 [IU] via INTRAVENOUS
  Filled 2018-06-19: qty 2000

## 2018-06-19 NOTE — Progress Notes (Signed)
ANTICOAGULATION CONSULT NOTE - Follow-Up Consult  Pharmacy Consult for Heparin Indication: chest pain/ACS  Allergies  Allergen Reactions  . Anesthesia S-I-40 [Propofol] Other (See Comments)    Hard to wake up    Patient Measurements: Height: 5\' 7"  (170.2 cm) Weight: 240 lb (108.9 kg) IBW/kg (Calculated) : 66.1 HEPARIN DW (KG): 90.5   Vital Signs: Temp: 97.7 F (36.5 C) (11/07 2332) Temp Source: Oral (11/07 2332) BP: 127/98 (11/07 2332) Pulse Rate: 70 (11/07 2332)  Labs: Recent Labs    06/18/18 1216 06/18/18 1714 06/18/18 2019 06/18/18 2251 06/19/18 0019  HGB 13.8  --   --   --  13.6  HCT 43.6  --   --   --  42.3  PLT 247  --   --   --  268  HEPARINUNFRC  --   --  <0.10*  --  <0.10*  CREATININE 0.90  --   --   --  1.05  TROPONINI  --  <0.03  --  <0.03  --     Estimated Creatinine Clearance: 97.9 mL/min (by C-G formula based on SCr of 1.05 mg/dL).   Medical History: Past Medical History:  Diagnosis Date  . Congenital absence of one kidney   . Diabetes mellitus   . Hay fever   . Hyperlipidemia   . Sleep apnea   . 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 heparin 1,400 Units/hr (06/18/18 2212)    Assessment: 52 yoM admitted with CP and started on heparin. EKG negative, troponins normal. Initial heparin level undetectable, no issues with infusion per RN.  11/8 AM update: heparin level remains undetectable, no issues per RN.   Goal of Therapy:  Heparin level 0.3-0.7 units/ml Monitor platelets by anticoagulation protocol: Yes   Plan:  Heparin 2000 units [employer] heparin to 1600 units/hr Re-check heparin level in 6-8 hours  Abran Duke, PharmD, BCPS Clinical Pharmacist Phone: [phone]

## 2018-06-19 NOTE — Progress Notes (Signed)
Progress Note  Patient Name: Bryan Case. Date of Encounter: 06/19/2018  Primary Cardiologist: Dr Jens Som  Subjective   Still with mild CP (continuous); no dyspnea  Inpatient Medications    Scheduled Meds: . aspirin EC  81 mg Oral Daily  . atorvastatin  40 mg Oral q1800  . insulin aspart  0-15 Units Subcutaneous TID WC  . insulin aspart  0-5 Units Subcutaneous QHS  . loratadine  10 mg Oral Daily   Continuous Infusions: . heparin 1,600 Units/hr (06/19/18 0604)   PRN Meds: acetaminophen, nitroGLYCERIN, ondansetron (ZOFRAN) IV   Vital Signs    Vitals:   06/18/18 1930 06/18/18 2332 06/19/18 0349 06/19/18 0500  BP: (!) 111/57 (!) 127/98 118/73   Pulse: 86 70 65   Resp: (!) 25 20 18    Temp: 97.7 F (36.5 C) 97.7 F (36.5 C) 98.5 F (36.9 C)   TempSrc: Oral Oral Oral   SpO2: 97% 96% 97%   Weight:    109.2 kg  Height:        Intake/Output Summary (Last 24 hours) at 06/19/2018 0722 Last data filed at 06/19/2018 1610 Gross per 24 hour  Intake 162.41 ml  Output 600 ml  Net -437.59 ml   Filed Weights   06/18/18 1118 06/19/18 0500  Weight: 108.9 kg 109.2 kg    Telemetry    Sinus- Personally Reviewed  ECG    NSR, no ST changes- Personally Reviewed  Physical Exam   GEN: No acute distress.   Neck: No JVD Cardiac: RRR, no murmurs, rubs, or gallops.  Respiratory: Clear to auscultation bilaterally. CP reproduced with palpation GI: Soft, nontender, non-distended  MS: No edema Neuro:  Nonfocal  Psych: Normal affect   Labs    Chemistry Recent Labs  Lab 06/18/18 1216 06/19/18 0019  NA 142 141  K 4.1 3.4*  CL 107 106  CO2 28 25  GLUCOSE 89 93  BUN 17 14  CREATININE 0.90 1.05  CALCIUM 8.8* 8.9  GFRNONAA >60 >60  GFRAA >60 >60  ANIONGAP 7 10     Hematology Recent Labs  Lab 06/18/18 1216 06/19/18 0019  WBC 6.3 6.7  RBC 4.94 4.97  HGB 13.8 13.6  HCT 43.6 42.3  MCV 88.3 85.1  MCH 27.9 27.4  MCHC 31.7 32.2  RDW 12.2 12.1  PLT 247 268     Cardiac Enzymes Recent Labs  Lab 06/18/18 1714 06/18/18 2251  TROPONINI <0.03 <0.03    Recent Labs  Lab 06/18/18 1214  TROPIPOC 0.00     DDimer  Recent Labs  Lab 06/18/18 1801  DDIMER 0.42     Radiology    Dg Chest 2 View  Result Date: 06/18/2018 CLINICAL DATA:  Chest pain shortness of breath for 1 week. EXAM: CHEST - 2 VIEW COMPARISON:  12/13/2014 FINDINGS: Cardiomediastinal silhouette is normal. Mediastinal contours appear intact. There is no evidence of focal airspace consolidation, pleural effusion or pneumothorax. Osseous structures are without acute abnormality. 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 Soft tissues are grossly normal. IMPRESSION: No active cardiopulmonary disease. Electronically Signed   By: Ted Mcalpine M.D.   On: 06/18/2018 11:43    Patient Profile     52 year old male with past medical history of diabetes mellitus, hyperlipidemia, obstructive sleep apnea, congenital absence of one kidney, prior substance abuse but clean for 25 years with chest pain.   Assessment & Plan    1 chest pain-patient continues with mild chest pain that has been  continuous.  It is improved this morning.  Reproduced with palpation.  Likely musculoskeletal.  Enzymes are negative.  Electrocardiogram shows no ST changes.  There was a pleuritic component.  However d-dimer is normal. Plan echo and stress nuclear study today. If negative, DC with outpt FU; continue ASA 81 mg daily; DC heparin.  2 diabetes mellitus-follow up primary care following DC.  3 hyperlipidemia-continue lipitor. Check lipids and liver 4 weeks following discharge.  For questions or updates, please contact [facility] Please consult [email or web address] for contact info under        Signed, Olga Millers, MD  06/19/2018, 7:22 AM

## 2018-06-19 NOTE — Progress Notes (Signed)
Discharge instructions given. Pt verbalized understanding and all questions were answered.  

## 2018-06-19 NOTE — Discharge Summary (Signed)
Discharge Summary    Patient ID: Bryan Case. MRN: [id number]; DOB: 27-Feb-1966  Admit date: 06/18/2018 Discharge date: 06/19/2018  Primary Care Provider: Eartha Inch, MD  Primary Cardiologist: Olga Millers, MD  Primary Electrophysiologist:  N/A  Discharge Diagnoses    Active Problems:   Unstable angina (HCC)   HTN   DM  OSA on CPAP  Congenital absence of one kidney  Allergies Allergies  Allergen Reactions  . Anesthesia S-I-40 [Propofol] Other (See Comments)    Hard to wake up    Diagnostic Studies/Procedures    NST 06/19/18  There was no ST segment deviation noted during stress.  No T wave inversion was noted during stress.  The left ventricular ejection fraction is mildly decreased (45-54%).  The study is normal.  This is a low risk study.   Normal pharmacologic nuclear stress test with no evidence for prior infarct or ischemia. LVEF calculated at 50% but visually appears better. Correlation with an echocardiogram is recommended.  2D Echo 06/19/18 shoes as pending reading Per Dr. Anne Fu " normal LVEF, mild LVH, grade 1 DD, mild MR". Unable to transfer result.     History of Present Illness     Bryan Case. is a 52 y.o. male with HLD, DM type 2, OSA on CPAP, congenital absence of one kidney, former polysubstance abuse sober for 25 years, who presented to the [facility] ED on 06/18/18 with exertional chest pain.   Bryan Case has been experiencing exertional chest pain and SOB for the past 2 weeks. He describes the pain as a pressure sensation which comes on with walking, last for 2-3 minutes at a time, and resolves with rest. He reports associated SOB, occasional diaphoresis, and nausea. He has noticed a decrease in the amount of activity needed to bring on symptoms. He saw his PCP for this complaint on 06/16/18 and was in the process of scheduling an outpatient appt with a cardiologist. He reported developing chest pressure shortly after waking this morning which was  persistent, prompting him to present to the ED for further evaluation.   He has no prior cardiac history. He reported having sharp chest pain during a stressful time in his life, for which he underwent a stress test in 2009 which he states was normal. He denies any recurrence of chest pain since that time until 2 weeks ago. He denies any family history of MI but notes HTN, HLD, and DM in both mother and father. He reports his son was born with an enlarged heart and passed away at a young age. He denies ETOH or tobacco abuse. He has a former history of polysubstance abuse but has been clean for 25 years.  ED course: initially hypertensive but normalized; otherwise VSS. Labs notable for electrolytes wnl, Cr 0.9, CBC wnl, Trop 0.00 x1. CXR without acute findings. EKG with isolated TWI in lead III; no STE/D. Patient was given ASA and SL nitro, started on a heparin gtt, and transferred [facility] for further cardiac work-up.    Hospital Course     Pt transferred to [facility] from [facility] on 06/18/18. Pt was seen by Dr. Jens Som. His description of CP symptoms was initially concerning, however he had no ST changes and initial troponin was negative after 24 hrs of chest pain. It is also reproduced with palpating his chest.  There is also a pleuritic component though no risk factors for pulmonary embolus. He was admitted for overnight observation. Cardiac enzymes were cycled and remained negative. D-dimer  also negative. He underwent nuclear stress test on 06/19/18 that showed no infract or ischemia. Low risk study.  He also had a 2D echo that showed normal LVEF, mild LVH, grade 1 DD, mild MR per Dr. Anne Fu. Unable to transfer result. Ambulated in hallway without exacerbating symptoms. His pain was reproducible with palpitation. Advised to take tylenol an follow up with PCP. No NSAID given one kidney. Start ASA 81mg  and Low dose Statin.   Pt was last seen and examined by Dr. Jens Som and felt stable for discharge home. Post  hospital f/u will be arranged w/APP.   Consultants: none  Discharge Vitals Blood pressure 116/67, pulse 72, temperature 98.2 F (36.8 C), temperature source Oral, resp. rate 18, height 5\' 7"  (1.702 m), weight 109.2 kg, SpO2 97 %.  Filed Weights   06/18/18 1118 06/19/18 0500  Weight: 108.9 kg 109.2 kg    Labs & Radiologic Studies    CBC Recent Labs    06/18/18 1216 06/19/18 0019  WBC 6.3 6.7  HGB 13.8 13.6  HCT 43.6 42.3  MCV 88.3 85.1  PLT 247 268   Basic Metabolic Panel Recent Labs    16/10/96 1216 06/19/18 0019  NA 142 141  K 4.1 3.4*  CL 107 106  CO2 28 25  GLUCOSE 89 93  BUN 17 14  CREATININE 0.90 1.05  CALCIUM 8.8* 8.9  Cardiac Enzymes Recent Labs    06/18/18 1714 06/18/18 2251  TROPONINI <0.03 <0.03   BNP Invalid input(s): POCBNP D-Dimer Recent Labs    06/18/18 1801  DDIMER 0.42   Hemoglobin A1C Recent Labs    06/19/18 0019  HGBA1C 5.8*   Fasting Lipid Panel Recent Labs    06/19/18 0019  CHOL 198  HDL 33*  LDLCALC 108*  TRIG 286*  CHOLHDL 6.0   Thyroid Function Tests No results for input(s): TSH, T4TOTAL, T3FREE, THYROIDAB in the last 72 hours.  Invalid input(s): FREET3 _____________  Dg Chest 2 View  Result Date: 06/18/2018 CLINICAL DATA:  Chest pain shortness of breath for 1 week. EXAM: CHEST - 2 VIEW COMPARISON:  12/13/2014 FINDINGS: Cardiomediastinal silhouette is normal. Mediastinal contours appear intact. There is no evidence of focal airspace consolidation, pleural effusion or pneumothorax. Osseous structures are without acute abnormality. 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 Soft tissues are grossly normal. IMPRESSION: No active cardiopulmonary disease. Electronically Signed   By: Ted Mcalpine M.D.   On: 06/18/2018 11:43   Nm Myocar Multi W/spect W/wall Motion / Ef  Result Date: 06/19/2018  There was no ST segment deviation noted during stress.  No T wave inversion was noted  during stress.  The left ventricular ejection fraction is mildly decreased (45-54%).  The study is normal.  This is a low risk study.  Normal pharmacologic nuclear stress test with no evidence for prior infarct or ischemia. LVEF calculated at 50% but visually appears better. Correlation with an echocardiogram is recommended.   Disposition   Pt is being discharged home today in good condition.  Follow-up Plans & Appointments    Follow-up Information    Lewayne Bunting, MD Follow up.   Specialty:  Cardiology Why:  our office will call you with a hospital f/u visit  Contact information: [address] [place] [address] [phone]        Eartha Inch, MD. Schedule an appointment as soon as possible for a visit in 3 day(s).   Specialty:  Family Medicine Contact information:  [address] [place] [address] [phone]          Discharge Instructions    Diet - low sodium heart healthy   Complete by:  As directed    Increase activity slowly   Complete by:  As directed       Discharge Medications   Allergies as of 06/19/2018      Reactions   Anesthesia S-i-40 [propofol] Other (See Comments)   Hard to wake up      Medication List    TAKE these medications   aspirin 81 MG EC tablet Take 1 tablet (81 mg total) by mouth daily. Start taking on:  06/20/2018   atorvastatin 40 MG tablet Commonly known as:  LIPITOR Take 1 tablet (40 mg total) by mouth daily at 6 PM.   cetirizine 10 MG tablet Commonly known as:  ZYRTEC Take 10 mg by mouth daily.   ezetimibe 10 MG tablet Commonly known as:  ZETIA Take 10 mg by mouth daily.   metFORMIN 500 MG tablet Commonly known as:  GLUCOPHAGE Take 2 tablets (1,000 mg total) by mouth 2 (two) times daily with a meal.   MULTIVITAMIN GUMMIES MENS Chew Chew 1 each by mouth daily.   vardenafil 20 MG tablet Commonly known as:  LEVITRA Take 20 mg by mouth daily as needed for erectile dysfunction.         Acute coronary syndrome (MI, NSTEMI, STEMI, etc) this admission?: No.    Outstanding Labs/Studies   Lipid panel and LFTs in 8 weeks.   Duration of Discharge Encounter   Greater than 30 minutes including physician time.  Lorelei Pont, PA 06/19/2018, 4:47 PM

## 2018-06-19 NOTE — Progress Notes (Signed)
  Echocardiogram 2D Echocardiogram has been performed.  Delcie Roch 06/19/2018, 8:24 AM

## 2018-06-22 ENCOUNTER — Telehealth: Payer: Self-pay

## 2018-06-22 NOTE — Telephone Encounter (Signed)
Sent referral to scheduling and filed notes 

## 2018-06-26 ENCOUNTER — Encounter: Payer: Self-pay | Admitting: Cardiology

## 2018-06-26 ENCOUNTER — Ambulatory Visit: Payer: BLUE CROSS/BLUE SHIELD | Admitting: Cardiology

## 2018-06-26 VITALS — BP 134/84 | HR 88 | Ht 67.0 in | Wt 246.6 lb

## 2018-06-26 DIAGNOSIS — E119 Type 2 diabetes mellitus without complications: Secondary | ICD-10-CM | POA: Diagnosis not present

## 2018-06-26 DIAGNOSIS — I200 Unstable angina: Secondary | ICD-10-CM | POA: Diagnosis not present

## 2018-06-26 DIAGNOSIS — E7800 Pure hypercholesterolemia, unspecified: Secondary | ICD-10-CM | POA: Diagnosis not present

## 2018-06-26 NOTE — Progress Notes (Signed)
HPI: Follow-up chest pain; recently admitted to [facility] with chest pain.  Ruled out for myocardial infarction.  D-dimer normal.  Echocardiogram November 2019 showed normal LV function, mild diastolic dysfunction and mild mitral regurgitation.  Nuclear study November 2019 showed ejection fraction 50% and no ischemia or infarction.  Chest pain felt to be likely musculoskeletal.  Since discharge he continues to have chest pain which has been continuous.  It increases with palpation.  Occasional dyspnea on exertion.  No orthopnea or PND.  No syncope.  Current Outpatient Medications  Medication Sig Dispense Refill  . busPIRone (BUSPAR) 5 MG tablet Take 5 mg by mouth.  1  . cetirizine (ZYRTEC) 10 MG tablet Take 10 mg by mouth daily.    Marland Kitchen. ezetimibe (ZETIA) 10 MG tablet Take 10 mg by mouth daily.    . metFORMIN (GLUCOPHAGE) 500 MG tablet Take 2 tablets (1,000 mg total) by mouth 2 (two) times daily with a meal. 120 tablet 3  . Multiple Vitamins-Minerals (MULTIVITAMIN GUMMIES MENS) CHEW Chew 1 each by mouth daily.    . vardenafil (LEVITRA) 20 MG tablet Take 20 mg by mouth daily as needed for erectile dysfunction.   1   No current facility-administered medications for this visit.      Past Medical History:  Diagnosis Date  . Congenital absence of one kidney   . Diabetes mellitus   . Hay fever   . Hyperlipidemia   . Sleep apnea   . 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 arm surgery     left  . NECK SURGERY  2016    Social History   Socioeconomic History  . Marital status: Married    Spouse name: Not on file  . Number of children: Not on file  . Years of education: Not on file  . Highest education level: Not on file  Occupational History  . Not on file  Social Needs  . Financial resource strain: Not on file  . Food insecurity:    Worry: Not on file    Inability: Not on file  . Transportation needs:      Medical: Not on file    Non-medical: Not on file  Tobacco Use  . Smoking status: Current Some Day Smoker    Types: Cigars  . Smokeless tobacco: Never Used  . Tobacco comment: smokes cigars every few months  Substance and Sexual Activity  . Alcohol use: No  . Drug use: No  . Sexual activity: Not on file  Lifestyle  . Physical activity:    Days per week: 1 day    Minutes per session: 90 min  . Stress: Not on file  Relationships  . Social connections:    Talks on phone: Not on file    Gets together: Not on file    Attends religious service: Not on file    Active member of club or organization: Not on file    Attends meetings of clubs or organizations: Not on file    Relationship status: Not on file  . Intimate partner violence:    Fear of current or ex partner: Not on file    Emotionally abused: Not on file    Physically abused: Not on file    Forced sexual activity: Not on file  Other Topics Concern  . Not on file  Social History Narrative   Tobacco use,  amount per day now:    Past tobacco use, amount per day: SOCIAL   How many years did you use tobacco: 5 YEARS   Alcohol use (drinks per week):NONE   Diet:YES PERIODICALLY   Do you drink/eat things with caffeine: YES   Marital status:  MARRIED                                What year were you married? 2017   Do you live in a house, apartment, assisted living, condo, trailer, etc.? HOUSE   Is it one or more stories? ONE   How many persons live in your home? 2   Do you have pets in your home?( please list) NO   Current or past profession:FACILITIES MANAGER    Do you exercise? YES              Type and how often?1-2 TIMES PER WEEK   Do you have a living will? NO   Do you have a DNR form?    NO                              If not, do you want to discuss one? NO   Do you have signed POA/HPOA forms?     NO                   If so, please bring to you appointment NO    Family History  Problem Relation Age of Onset  .  Heart disease Mother   . Diabetes Mother   . Hyperlipidemia Mother   . Hypertension Mother   . Seizures Father   . Renal Disease Father   . Cancer Maternal Grandmother        breast  . Arthritis Paternal Grandmother   . Diabetes Paternal Grandmother   . Heart disease Paternal Grandmother   . Hyperlipidemia Paternal Grandmother   . Hypertension Paternal Grandmother     ROS: Anxiety bu no fevers or chills, productive cough, hemoptysis, dysphasia, odynophagia, melena, hematochezia, dysuria, hematuria, rash, seizure activity, orthopnea, PND, pedal edema, claudication. Remaining systems are negative.  Physical Exam: Well-developed well-nourished in no acute distress.  Skin is warm and dry.  HEENT is normal.  Neck is supple.  Chest is clear to auscultation with normal expansion. Chest pain reproduced with palpation Cardiovascular exam is regular rate and rhythm.  Abdominal exam nontender or distended. No masses palpated. Extremities show no edema. neuro grossly intact  A/P  1 chest pain-recent admission with negative enzymes and normal d-dimer.  LV function normal on echocardiogram and no ischemia on nuclear study.  Plan medical therapy.  Chest pain is reproduced with palpation.  Likely musculoskeletal.  Note it has been continuous without complete resolution.  2 hyperlipidemia-given diabetes mellitus would like to be aggressive with lipid therapy.  He apparently has not tolerated one particular statin in the past but does not recall which one.  We will try Lipitor 40 mg daily.  If he tolerates we will continue.  Check lipids and liver in 4 weeks.  I will also add aspirin 81 mg daily.  3 diabetes mellitus-follow-up primary care.  Olga Millers, MD

## 2018-06-26 NOTE — Patient Instructions (Addendum)
Medication Instructions:  Start taking Lipitor 40 mg daily Start taking Aspirin 81 mg daily If you need a refill on your cardiac medications before your next appointment, please call your pharmacy.   Lab work: Monday 07/27/18 Lipid and Liver panels Lab opens at 8:00 am to 12:00 noon no appointment needed. If you have labs (blood work) drawn today and your tests are completely normal, you will receive your results only by: Marland Kitchen. MyChart Message (if you have MyChart) OR . A paper copy in the mail If you have any lab test that is abnormal or we need to change your treatment, we will call you to review the results.  Testing/Procedures: None ordered  Follow-Up: At [facility], you and your health needs are our priority.  As part of our continuing mission to provide you with exceptional heart care, we have created designated Provider Care Teams.  These Care Teams include your primary Cardiologist (physician) and Advanced Practice Providers (APPs -  Physician Assistants and Nurse Practitioners) who all work together to provide you with the care you need, when you need it. . Follow up with Dr.Crenshaw in 6 months call 2 months before to schedule

## 2018-12-15 NOTE — Progress Notes (Signed)
Virtual Visit via Video Note   This visit type was conducted due to national recommendations for restrictions regarding the COVID-19 Pandemic (e.g. social distancing) in an effort to limit this patient's exposure and mitigate transmission in our community.  Due to his co-morbid illnesses, this patient is at least at moderate risk for complications without adequate follow up.  This format is felt to be most appropriate for this patient at this time.  All issues noted in this document were discussed and addressed.  A limited physical exam was performed with this format.  Please refer to the patient's chart for his consent to telehealth for [facility].   Date:  12/16/2018   ID:  Bryan Case., DOB 1966/05/26, MRN [id number]  Patient Location: Home Provider Location: Home  PCP:  Eartha Inch, MD  Cardiologist:  Olga Millers, MD   Evaluation Performed:  Follow-Up Visit  Chief Complaint:  Chest pain  History of Present Illness:    Follow-up chest pain; previously admitted to [facility] with chest pain.  Ruled out for myocardial infarction. D-dimer normal. Echocardiogram November 2019 showed normal LV function, mild diastolic dysfunction and mild mitral regurgitation.  Nuclear study November 2019 showed ejection fraction 50% and no ischemia or infarction.  Chest pain felt to be likely musculoskeletal.  Since last seen, patient has dyspnea with more vigorous activities.  Not with routine activities.  No orthopnea, PND, syncope or chest pain.  The patient does not have symptoms concerning for COVID-19 infection (fever, chills, cough, or new shortness of breath).    Past Medical History:  Diagnosis Date  . Congenital absence of one kidney   . Diabetes mellitus   . Hay fever   . Hyperlipidemia   . Sleep apnea   . Solitary kidney    Born with only one kidney   Past Surgical History:  Procedure Laterality Date  . arm surgery     left  . NECK SURGERY  2016      Current Meds  Medication Sig  . aspirin EC 81 MG tablet Take 1 tablet (81 mg total) by mouth daily.  Marland Kitchen ezetimibe (ZETIA) 10 MG tablet Take 10 mg by mouth daily.  . metFORMIN (GLUCOPHAGE) 500 MG tablet Take 2 tablets (1,000 mg total) by mouth 2 (two) times daily with a meal.  . Multiple Vitamins-Minerals (MULTIVITAMIN GUMMIES MENS) CHEW Chew 1 each by mouth daily.  . vardenafil (LEVITRA) 20 MG tablet Take 20 mg by mouth daily as needed for erectile dysfunction.      Allergies:   Anesthesia s-i-40 [propofol]   Social History   Tobacco Use  . Smoking status: Current Some Day Smoker    Types: Cigars  . Smokeless tobacco: Never Used  . Tobacco comment: smokes cigars every few months  Substance Use Topics  . Alcohol use: No  . Drug use: No     Family Hx: The patient's family history includes Arthritis in his paternal grandmother; Cancer in his maternal grandmother; Diabetes in his mother and paternal grandmother; Heart disease in his mother and paternal grandmother; Hyperlipidemia in his mother and paternal grandmother; Hypertension in his mother and paternal grandmother; Renal Disease in his father; Seizures in his father.  ROS:   Please see the history of present illness.    No fevers, chills or productive cough. All other systems reviewed and are negative.  Recent Labs: 01/26/2018: ALT 35 06/19/2018: BUN 14; Creatinine, Ser 1.05; Hemoglobin 13.6; Platelets 268; Potassium 3.4; Sodium 141  Recent Lipid Panel Lab Results  Component Value Date/Time   CHOL 198 06/19/2018 12:19 AM   TRIG 286 (H) 06/19/2018 12:19 AM   HDL 33 (L) 06/19/2018 12:19 AM   CHOLHDL 6.0 06/19/2018 12:19 AM   LDLCALC 108 (H) 06/19/2018 12:19 AM   LDLCALC 99 01/26/2018 08:40 AM    Wt Readings from Last 3 Encounters:  12/16/18 245 lb (111.1 kg)  06/26/18 246 lb 9.6 oz (111.9 kg)  06/19/18 240 lb 11.9 oz (109.2 kg)     Objective:    Vital Signs:  Ht 5\' 7"  (1.702 m)   Wt 245 lb (111.1 kg)   BMI 38.37  kg/m    VITAL SIGNS:  reviewed  No acute distress Answers questions appropriately Normal affect Remainder of physical examination not performed (telehealth visit; coronavirus pandemic)  ASSESSMENT & PLAN:    1. Chest pain-previous symptoms felt to be atypical and likely musculoskeletal.  No recurrence of chest pain.  Cardiac work-up negative.  We will not pursue further ischemia evaluation. 2. Hyperlipidemia-continue Zetia.  Had myalgias with statins. 3. Diabetes mellitus-Per primary care.  COVID-19 Education: The importance of social distancing was discussed today.  Time:   Today, I have spent 10 minutes with the patient with telehealth technology discussing the above problems.     Medication Adjustments/Labs and Tests Ordered: Current medicines are reviewed at length with the patient today.  Concerns regarding medicines are outlined above.   Tests Ordered: No orders of the defined types were placed in this encounter.   Medication Changes: No orders of the defined types were placed in this encounter.   Disposition:  Follow up in 6 month(s)  Signed, Olga MillersBrian Crenshaw, MD  12/16/2018 3:29 PM    [facility]

## 2018-12-16 ENCOUNTER — Telehealth (INDEPENDENT_AMBULATORY_CARE_PROVIDER_SITE_OTHER): Payer: BLUE CROSS/BLUE SHIELD | Admitting: Cardiology

## 2018-12-16 VITALS — Ht 67.0 in | Wt 245.0 lb

## 2018-12-16 DIAGNOSIS — E7800 Pure hypercholesterolemia, unspecified: Secondary | ICD-10-CM

## 2018-12-16 DIAGNOSIS — R072 Precordial pain: Secondary | ICD-10-CM | POA: Diagnosis not present

## 2018-12-16 NOTE — Patient Instructions (Signed)

## 2019-08-12 ENCOUNTER — Other Ambulatory Visit: Payer: Self-pay

## 2019-08-12 ENCOUNTER — Encounter: Payer: Self-pay | Admitting: Physician Assistant

## 2019-08-12 ENCOUNTER — Ambulatory Visit (INDEPENDENT_AMBULATORY_CARE_PROVIDER_SITE_OTHER): Payer: BC Managed Care – PPO | Admitting: Physician Assistant

## 2019-08-12 VITALS — BP 125/77 | HR 102 | Temp 97.2°F | Ht 67.0 in | Wt 248.0 lb

## 2019-08-12 DIAGNOSIS — E785 Hyperlipidemia, unspecified: Secondary | ICD-10-CM

## 2019-08-12 DIAGNOSIS — R0789 Other chest pain: Secondary | ICD-10-CM | POA: Diagnosis not present

## 2019-08-12 DIAGNOSIS — E119 Type 2 diabetes mellitus without complications: Secondary | ICD-10-CM

## 2019-08-12 DIAGNOSIS — G4733 Obstructive sleep apnea (adult) (pediatric): Secondary | ICD-10-CM

## 2019-08-12 DIAGNOSIS — R002 Palpitations: Secondary | ICD-10-CM

## 2019-08-12 NOTE — Patient Instructions (Signed)
Medication Instructions:  Your physician recommends that you continue on your current medications as directed. Please refer to the Current Medication list given to you today.  *If you need a refill on your cardiac medications before your next appointment, please call your pharmacy*   Follow-Up: At [facility], you and your health needs are our priority.  As part of our continuing mission to provide you with exceptional heart care, we have created designated Provider Care Teams.  These Care Teams include your primary Cardiologist (physician) and Advanced Practice Providers (APPs -  Physician Assistants and Nurse Practitioners) who all work together to provide you with the care you need, when you need it.  Your next appointment:   6 month(s)  The format for your next appointment:   Either In Person or Virtual  Provider:   You may see Brian Crenshaw, MD or one of the following Advanced Practice Providers on your designated Care Team:    Luke Kilroy, PA-C  Callie Goodrich, PA-C  Jesse Cleaver, FNP    

## 2019-08-12 NOTE — Progress Notes (Signed)
Cardiology Office Note:    Date:  08/14/2019   ID:  [name]., DOB April 03, 1966, MRN [id number]  PCP:  Eartha Inch, MD  Cardiologist:  Olga Millers, MD  Electrophysiologist:  None   Referring MD: Eartha Inch, MD   Chief Complaint  Patient presents with  . Follow-up    seen for Dr. Jens Som    History of Present Illness:    Bryan Case. is a 53 y.o. male with a hx of congenital absence of left kidney, DM 2, hyperlipidemia, obstructive sleep apnea, chronic pain syndrome and history of substance abuse in the past.  Patient was previously admitted to [facility] with chest pain.  Troponin was negative.  D-dimer was normal.  Echocardiogram obtained in November 2019 showed normal LV function, mild diastolic dysfunction, mild MR.  Myoview obtained in November 2019 showed EF 50%, no ischemia or infarction.  Chest pain was felt to be musculoskeletal in nature.  Patient was last seen virtually by Dr. Jens Som on 12/16/2018 at which time he was doing well.  Since last office visit, patient has been seen by cardiology service at [facility] for palpitation.  A 10-day heart monitor was recommended.  Heart monitor performed in October showed mostly sinus rhythm, bradycardia down to the 50s and night, 5834 supraventricular beats and 219 PVCs.  Slowest heart rate was 51 bpm.  Fastest heart rate 171 bpm that occurred at 4:08 PM on September 25, this likely represents sinus tachycardia.  No evidence of SVT, no atrial fibrillation or VT.  No significant pauses longer than 3 seconds was noted.  Patient presents today for cardiology office visit.  He still occasionally have chest pain, however discomfort move all over the place.  Sometimes it is on the flank of the body, other time it is in the epigastric other area and other time the pain is in the chest.  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  This is still likely musculoskeletal pain.  His recent lab work from PCP  demonstrated low HDL and a borderline elevated LDL.  We discussed various options, we opted to leave him on Zetia at this point.  I encouraged him to improve his diet to a healthier meal.  He has a repeat lab work in next February by his PCP.  If cholesterol still uncontrolled, I did discuss with the patient the potential possibility of switching him to a low-dose statin.   Past Medical History:  Diagnosis Date  . Congenital absence of one kidney   . Diabetes mellitus   . Hay fever   . Hyperlipidemia   . Sleep apnea   . 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 arm surgery     left  . NECK SURGERY  2016    Current Medications: Current Meds  Medication Sig  . aspirin EC 81 MG tablet Take 1 tablet (81 mg total) by mouth daily.  . chlorhexidine (PERIDEX) 0.12 % solution 15 mLs 2 (two) times daily.  Marland Kitchen ezetimibe (ZETIA) 10 MG tablet Take 10 mg by mouth daily.  . metFORMIN (GLUCOPHAGE) 500 MG tablet Take 2 tablets (1,000 mg total) by mouth 2 (two) times daily with a meal.  . Multiple Vitamins-Minerals (MULTIVITAMIN GUMMIES MENS) CHEW Chew 1 each by mouth daily.  . vardenafil (LEVITRA) 20 MG tablet Take 20 mg by mouth daily as needed for erectile dysfunction.   . [  DISCONTINUED] ketoconazole (NIZORAL) 2 % cream APPLY TOPICALLY ONCE DAILY  . [DISCONTINUED] magnesium oxide (MAG-OX) 400 MG tablet Take by mouth.  . [DISCONTINUED] pantoprazole (PROTONIX) 40 MG tablet Take 40 mg by mouth daily.  . [DISCONTINUED] pravastatin (PRAVACHOL) 20 MG tablet Take by mouth.  . [DISCONTINUED] predniSONE (DELTASONE) 20 MG tablet   . [DISCONTINUED] tiZANidine (ZANAFLEX) 4 MG tablet Take 4 mg by mouth 3 (three) times daily.     Allergies:   Anesthesia s-i-40 [propofol]   Social History   Socioeconomic History  . Marital status: Married    Spouse name: Not on file  . Number of children: Not on file  . Years of education: Not on file  .  Highest education level: Not on file  Occupational History  . Not on file  Tobacco Use  . Smoking status: Current Some Day Smoker    Types: Cigars  . Smokeless tobacco: Never Used  . Tobacco comment: smokes cigars every few months  Substance and Sexual Activity  . Alcohol use: No  . Drug use: No  . Sexual activity: Not on file  Other Topics Concern  . Not on file  Social History Narrative   Tobacco use, amount per day now:    Past tobacco use, amount per day: SOCIAL   How many years did you use tobacco: 5 YEARS   Alcohol use (drinks per week):NONE   Diet:YES PERIODICALLY   Do you drink/eat things with caffeine: YES   Marital status:  MARRIED                                What year were you married? 2017   Do you live in a house, apartment, assisted living, condo, trailer, etc.? HOUSE   Is it one or more stories? ONE   How many persons live in your home? 2   Do you have pets in your home?( please list) NO   Current or past profession:FACILITIES MANAGER    Do you exercise? YES              Type and how often?1-2 TIMES PER WEEK   Do you have a living will? NO   Do you have a DNR form?    NO                              If not, do you want to discuss one? NO   Do you have signed POA/HPOA forms?     NO                   If so, please bring to you appointment NO   Social Determinants of Health   Financial Resource Strain:   . Difficulty of Paying Living Expenses: Not on file  Food Insecurity:   . Worried About Charity fundraiser in the Last Year: Not on file  . Ran Out of Food in the Last Year: Not on file  Transportation Needs:   . Lack of Transportation (Medical): Not on file  . Lack of Transportation (Non-Medical): Not on file  Physical Activity:   . Days of Exercise per Week: Not on file  . Minutes of Exercise per Session: Not on file  Stress:   . Feeling of Stress : Not on file  Social Connections:   . Frequency of Communication with Friends and Family: Not on  file    . Frequency of Social Gatherings with Friends and Family: Not on file  . Attends Religious Services: Not on file  . Active Member of Clubs or Organizations: Not on file  . Attends Banker Meetings: Not on file  . Marital Status: Not on file     Family History: The patient's family history includes Arthritis in his paternal grandmother; Cancer in his maternal grandmother; Diabetes in his mother and paternal grandmother; Heart disease in his mother and paternal grandmother; Hyperlipidemia in his mother and paternal grandmother; Hypertension in his mother and paternal grandmother; Renal Disease in his father; Seizures in his father.  ROS:   Please see the history of present illness.     All other systems reviewed and are negative.  EKGs/Labs/Other Studies Reviewed:    The following studies were reviewed today:  10 day Heart Monitor at [facility] 05/2019 Indication: Palpitations.  Patient remained in sinus rhythm with average heart rate 86 bpm.  Longest episode of tachycardia lasted 2 hours 7 minutes and 8 seconds at 2:55 PM on September 25. Average heart rate was 110 bpm during episode of tachycardia.  Longest episode of bradycardia lasted 30 minutes and 54 seconds at 2:39 AM on September 26. Average heart rate during episode of bradycardia was 56 bpm.  5834 supraventricular beats noted representing less than 1% total beats. 219 PVCs noted representing less than 1% of total beats.  The slowest heart rate was noted 51 bpm at 4:48 AM on September 26. Heart rate was 55 bpm at 2:43 AM on October 4. Heart rate was 53 bpm at 5:53 AM on October 18.   Fastest heart rate was 171 bpm at 4:08 PM on September 25. It likely represented sinus tachycardia. Sinus tachycardia with heart rate 154 bpm noted at 4:11 PM on October 8.  Longest R to R interval was noted 1.2 seconds at 4:48 AM on September 26.   No SVT, no A. fib, no VT, no long pauses 3 seconds or more noted.  EKG:   EKG is ordered today.  The ekg ordered today demonstrates mild sinus tachycardia, heart rate 102, no significant ST-T wave changes.  Recent Labs: No results found for requested labs within last 8760 hours.  Recent Lipid Panel    Component Value Date/Time   CHOL 198 06/19/2018 0019   TRIG 286 (H) 06/19/2018 0019   HDL 33 (L) 06/19/2018 0019   CHOLHDL 6.0 06/19/2018 0019   VLDL 57 (H) 06/19/2018 0019   LDLCALC 108 (H) 06/19/2018 0019   LDLCALC 99 01/26/2018 0840    Physical Exam:    VS:  BP 125/77   Pulse (!) 102   Temp (!) 97.2 F (36.2 C)   Ht 5\' 7"  (1.702 m)   Wt 248 lb (112.5 kg)   SpO2 97%   BMI 38.84 kg/m     Wt Readings from Last 3 Encounters:  08/12/19 248 lb (112.5 kg)  12/16/18 245 lb (111.1 kg)  06/26/18 246 lb 9.6 oz (111.9 kg)     GEN:  Well nourished, well developed in no acute distress HEENT: Normal NECK: No JVD; No carotid bruits LYMPHATICS: No lymphadenopathy CARDIAC: RRR, no murmurs, rubs, gallops RESPIRATORY:  Clear to auscultation without rales, wheezing or rhonchi  ABDOMEN: Soft, non-tender, non-distended MUSCULOSKELETAL:  No edema; No deformity  SKIN: Warm and dry NEUROLOGIC:  Alert and oriented x 3 PSYCHIATRIC:  Normal affect   ASSESSMENT:    1. Atypical chest pain  2. Hyperlipidemia LDL goal <70   3. Controlled type 2 diabetes mellitus without complication, without long-term current use of insulin (HCC)   4. OSA (obstructive sleep apnea)   5. Palpitations    PLAN:    In order of problems listed above:  1. Atypical chest pain: His pain moves all over the body, location of pain include the flank of the body, epigastric area and occasionally in the chest.  Symptom does not seem to occur with physical exertion.  Myoview in 2019 was normal.  Suspect this is musculoskeletal issue.  2. Palpitation: [facility], heart monitor did not show significant irregular  rhythm.  3. Hyperlipidemia: Continue Zetia.  LDL borderline elevated, we recommend diet and exercise for the time being, however he is aware that if his blood work continue to show uncontrolled hyperlipidemia, we will recommend switch to a statin therapy.  4. DM2: Managed by primary care provider.  On Metformin.  5. Obstructive sleep apnea: Continue CPAP therapy.   Medication Adjustments/Labs and Tests Ordered: Current medicines are reviewed at length with the patient today.  Concerns regarding medicines are outlined above.  Orders Placed This Encounter  Procedures  . EKG 12-Lead   No orders of the defined types were placed in this encounter.   Patient Instructions  Medication Instructions:  Your physician recommends that you continue on your current medications as directed. Please refer to the Current Medication list given to you today.  *If you need a refill on your cardiac medications before your next appointment, please call your pharmacy*   Follow-Up: At [facility], you and your health needs are our priority.  As part of our continuing mission to provide you with exceptional heart care, we have created designated Provider Care Teams.  These Care Teams include your primary Cardiologist (physician) and Advanced Practice Providers (APPs -  Physician Assistants and Nurse Practitioners) who all work together to provide you with the care you need, when you need it.  Your next appointment:   6 month(s)  The format for your next appointment:   Either In Person or Virtual  Provider:   You may see Olga MillersBrian Crenshaw, MD or one of the following Advanced Practice Providers on your designated Care Team:    Corine ShelterLuke Kilroy, PA-C  [place]ie Goodrich, PA-C  Edd FabianJesse Cleaver, FNP       Signed, Azalee CourseHao Osker Ayoub, [place]PA  08/14/2019 9:15 PM    [facility]

## 2019-08-14 ENCOUNTER — Encounter: Payer: Self-pay | Admitting: Physician Assistant

## 2020-03-05 IMAGING — CR DG CHEST 2V
3 series · 3 of 3 positions shown · non-contrast
Comparison: 12/13/2014

CLINICAL DATA: Chest pain shortness of breath for 1 week.

EXAM:
CHEST - 2 VIEW

[w chest pa]
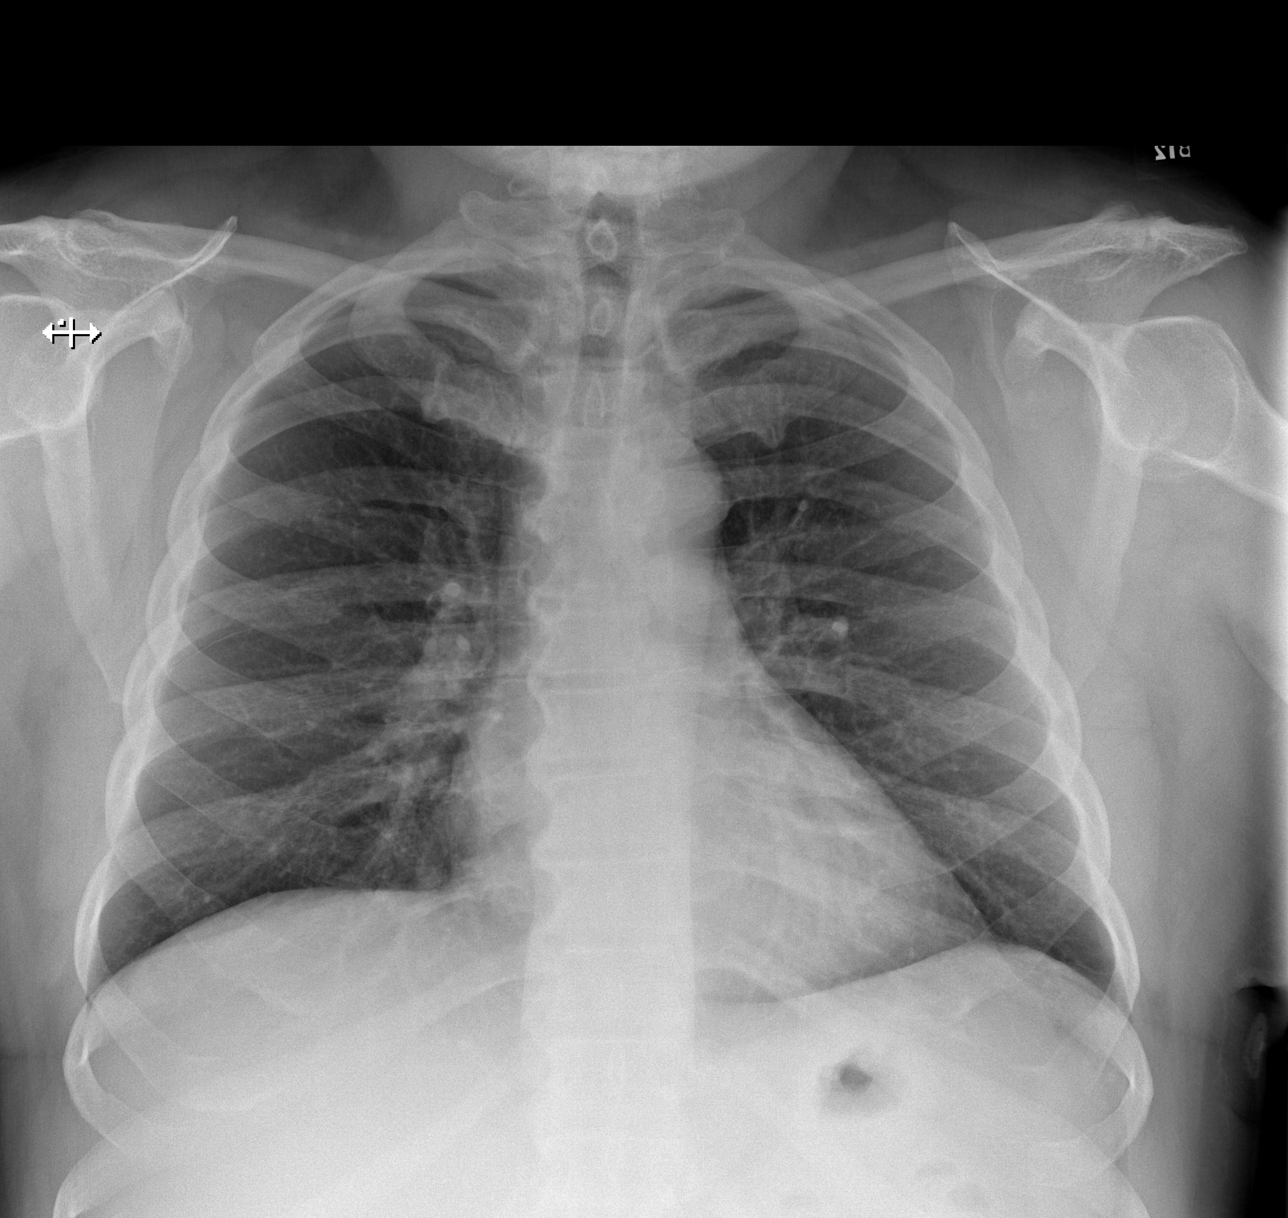

[w chest lat (1 of 2)]
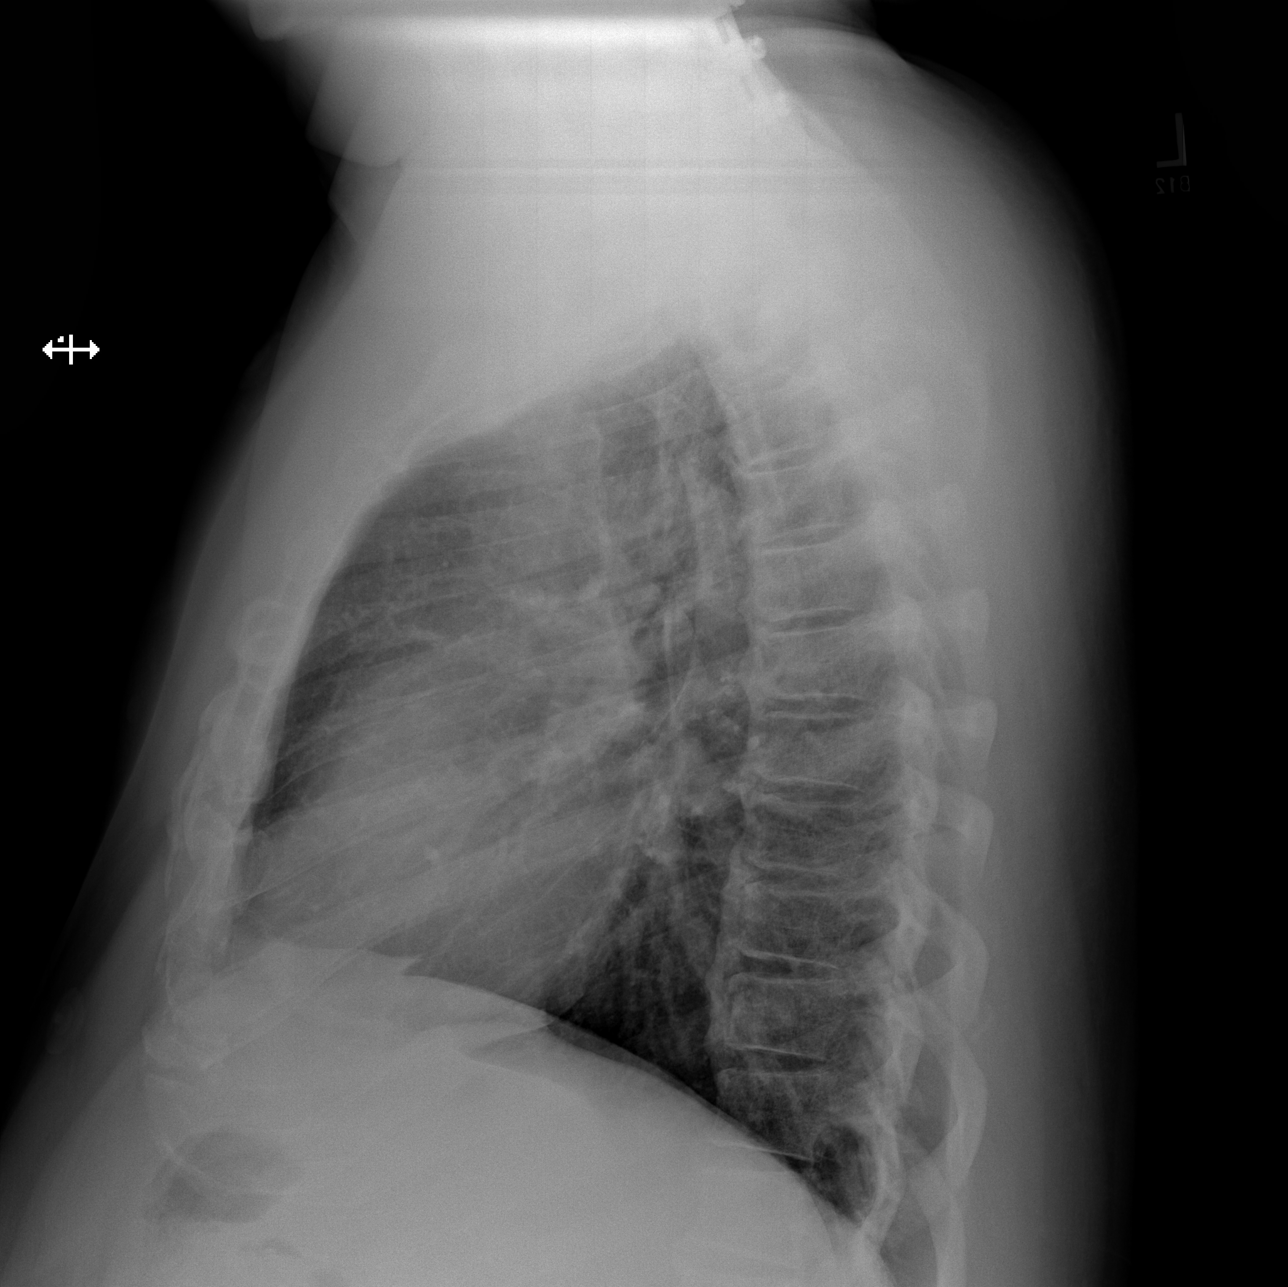

[w chest lat (2 of 2)]
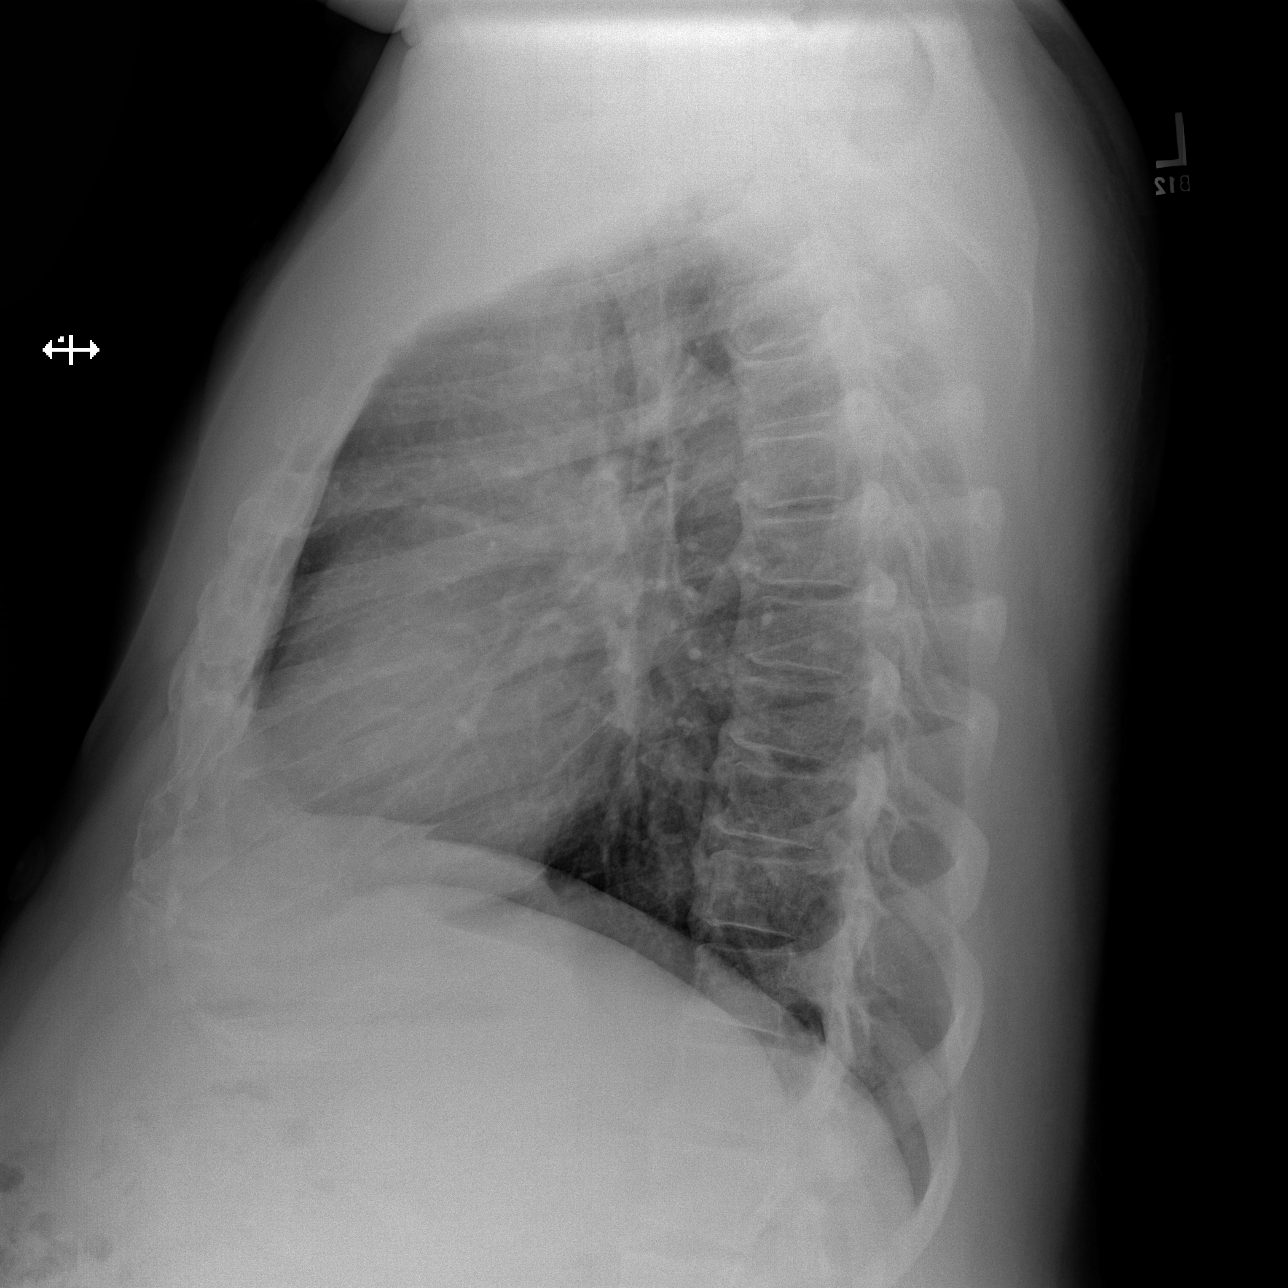

[3 of 3 positions shown; findings below may reference images not displayed]

FINDINGS: Cardiomediastinal silhouette is normal. Mediastinal contours appear
intact.

There is no evidence of focal airspace consolidation, pleural
effusion or pneumothorax.

Osseous structures are without acute abnormality. Stigmata of
diffuse idiopathic skeletal hyperostosis of the thoracic spine. Soft
tissues are grossly normal.
IMPRESSION: No active cardiopulmonary disease.

## 2022-03-02 LAB — GLUCOSE, POCT (MANUAL RESULT ENTRY): POC Glucose: 140 mg/dl — AB (ref 70–99)

## 2022-09-21 NOTE — Progress Notes (Signed)
Pt is aware of occasional high BP. Will keep log and bring to appointment at the end of the month.

## 2022-10-01 ENCOUNTER — Encounter: Payer: Self-pay | Admitting: *Deleted

## 2022-10-01 NOTE — Progress Notes (Signed)
CH[facility] documentation notes pt has established PCP, Dr. Anastasia Pall, with whom he has appt at [facility] on 10/10/22. 09/21/22 event nurse noted that pt would share his screening results with his PCP at that appt at the end of this month. No SDOH needs noted by pt at this event. No additional health equity team support indicated at this time.

## 2024-04-13 ENCOUNTER — Encounter (HOSPITAL_BASED_OUTPATIENT_CLINIC_OR_DEPARTMENT_OTHER): Payer: Self-pay

## 2024-04-13 ENCOUNTER — Other Ambulatory Visit: Payer: Self-pay

## 2024-04-13 ENCOUNTER — Emergency Department (HOSPITAL_BASED_OUTPATIENT_CLINIC_OR_DEPARTMENT_OTHER)
Admission: EM | Admit: 2024-04-13 | Discharge: 2024-04-13 | Disposition: A | Discharge status: Home / Self Care | Source: Ambulatory Visit | Attending: Emergency Medicine | Admitting: Emergency Medicine

## 2024-04-13 ENCOUNTER — Emergency Department (HOSPITAL_BASED_OUTPATIENT_CLINIC_OR_DEPARTMENT_OTHER)

## 2024-04-13 DIAGNOSIS — R519 Headache, unspecified: Secondary | ICD-10-CM | POA: Diagnosis present

## 2024-04-13 DIAGNOSIS — M542 Cervicalgia: Secondary | ICD-10-CM | POA: Insufficient documentation

## 2024-04-13 DIAGNOSIS — Z7982 Long term (current) use of aspirin: Secondary | ICD-10-CM | POA: Insufficient documentation

## 2024-04-13 DIAGNOSIS — Z91010 Allergy to peanuts: Secondary | ICD-10-CM | POA: Insufficient documentation

## 2024-04-13 LAB — COMPREHENSIVE METABOLIC PANEL WITH GFR
ALT: 29 U/L (ref 0–44)
AST: 19 U/L (ref 15–41)
Albumin: 4.7 g/dL (ref 3.5–5.0)
Alkaline Phosphatase: 95 U/L (ref 38–126)
Anion gap: 12 (ref 5–15)
BUN: 16 mg/dL (ref 6–20)
CO2: 21 mmol/L — ABNORMAL LOW (ref 22–32)
Calcium: 9.8 mg/dL (ref 8.9–10.3)
Chloride: 105 mmol/L (ref 98–111)
Creatinine, Ser: 0.84 mg/dL (ref 0.61–1.24)
GFR, Estimated: >60 mL/min (ref >60)
Glucose, Bld: 133 mg/dL — ABNORMAL HIGH (ref 70–99)
Potassium: 4.1 mmol/L (ref 3.5–5.1)
Sodium: 139 mmol/L (ref 135–145)
Total Bilirubin: 0.4 mg/dL (ref 0.0–1.2)
Total Protein: 7.5 g/dL (ref 6.5–8.1)

## 2024-04-13 LAB — CBC
HCT: 46.7 % (ref 39.0–52.0)
Hemoglobin: 15.1 g/dL (ref 13.0–17.0)
MCH: 26.8 pg (ref 26.0–34.0)
MCHC: 32.3 g/dL (ref 30.0–36.0)
MCV: 82.8 fL (ref 80.0–100.0)
Platelets: 276 K/uL (ref 150–400)
RBC: 5.64 MIL/uL (ref 4.22–5.81)
RDW: 13 % (ref 11.5–15.5)
WBC: 6.9 K/uL (ref 4.0–10.5)
nRBC: 0 % (ref 0.0–0.2)

## 2024-04-13 MED ORDER — PREDNISONE 20 MG PO TABS
40.0000 mg | ORAL_TABLET | Freq: Once | ORAL | Status: AC
  Administered 2024-04-13: 40 mg via ORAL
  Filled 2024-04-13: qty 2

## 2024-04-13 MED ORDER — METHYLPREDNISOLONE 4 MG PO TBPK: ORAL_TABLET | ORAL | 0 refills | Status: DC

## 2024-04-13 MED ORDER — METHYLPREDNISOLONE 4 MG PO TBPK: ORAL_TABLET | ORAL | 0 refills | Status: AC

## 2024-04-13 MED ORDER — IOHEXOL 350 MG/ML SOLN
75.0000 mL | Freq: Once | INTRAVENOUS | Status: AC | PRN
  Administered 2024-04-13: 75 mL via INTRAVENOUS

## 2024-04-13 NOTE — ED Provider Notes (Signed)
 [facility] AT [facility] Provider Note   CSN: [id number] Arrival date & time: 04/13/24  1226     Patient presents with: Headache and Dizziness   Bryan Case. is a 58 y.o. male.   Patient with diabetes, solitary kidney with normal renal function, high cholesterol, history of spine surgery including cervical spine --presents to the emergency department for evaluation of new headache.  Patient states that he gets occasional headaches that he feels stem from previous surgery.  He states that he will take a Tylenol  and symptoms get better, however about 2 weeks ago, initially starting while washing a car, he has developed sharp pain in the neck and back of the head with radiation to the top of the head.  He has had associated nausea without vomiting.  Associated light sensitivity.  Pain does not radiate into the upper or lower extremities.  He has some mild weakness in his right hand that is at baseline.  Headache seems to have started fairly acutely.  Patient has been able to continue to do tasks such as work on his car, drive.  He drove to the ED today.  He saw his doctor who prescribed a migraine medication (Nurtec) which has not helped.  Patient denies signs of stroke including: facial droop, slurred speech, aphasia, weakness/numbness in extremities, imbalance/trouble walking.        Prior to Admission medications   Medication Sig Start Date End Date Taking? Authorizing Provider  Continuous Glucose Sensor (FREESTYLE LIBRE 3 PLUS SENSOR) MISC Place 1 each onto the skin. 03/23/24  Yes [provider]  hydrOXYzine (ATARAX) 25 MG tablet Take 25-50 mg by mouth at bedtime. 02/03/24  Yes [provider]  pantoprazole (PROTONIX) 40 MG tablet Take 40 mg by mouth daily. 03/04/24  Yes [provider]  pravastatin (PRAVACHOL) 40 MG tablet Take 40 mg by mouth every evening. 03/23/24  Yes [provider]  sertraline (ZOLOFT) 50 MG tablet  Take 50 mg by mouth daily. 03/11/24  Yes [provider]  SYNJARDY XR 12.12-998 MG TB24 Take 2 tablets by mouth daily. 03/23/24  Yes [provider]  aspirin  EC 81 MG tablet Take 1 tablet (81 mg total) by mouth daily. 06/26/18   Pietro Redell RAMAN, MD  chlorhexidine (PERIDEX) 0.12 % solution 15 mLs 2 (two) times daily. 08/09/19   [provider]  ezetimibe (ZETIA) 10 MG tablet Take 10 mg by mouth daily.    [provider]  metFORMIN  (GLUCOPHAGE ) 500 MG tablet Take 2 tablets (1,000 mg total) by mouth 2 (two) times daily with a meal. 01/27/18   Caro, Harlene POUR, NP  Multiple Vitamins-Minerals (MULTIVITAMIN GUMMIES MENS) CHEW Chew 1 each by mouth daily.    [provider]  NURTEC 75 MG TBDP Take 1 tablet by mouth every other day.    [provider]  vardenafil (LEVITRA) 20 MG tablet Take 20 mg by mouth daily as needed for erectile dysfunction.  06/07/18   [provider]    Allergies: Montelukast, Peanut oil, and Anesthesia s-i-40 [propofol]    Review of Systems  Updated Vital Signs BP 134/82 (BP Location: Right Arm)   Pulse 78   Temp 97.6 F (36.4 C) (Oral)   Resp 18   Ht 5' 7 (1.702 m)   Wt 104.3 kg   SpO2 97%   BMI 36.02 kg/m   Physical Exam Vitals and nursing note reviewed.  Constitutional:      Appearance: He is well-developed.  HENT:     Head: Normocephalic and atraumatic.     Right Ear: Tympanic membrane, ear canal and external ear normal.     Left Ear: Tympanic membrane, ear canal and external ear normal.     Nose: Nose normal.     Mouth/Throat:     Pharynx: Uvula midline.  Eyes:     General: Lids are normal.     Conjunctiva/sclera: Conjunctivae normal.     Pupils: Pupils are equal, round, and reactive to light.  Neck:     Comments: Full range of motion of neck, no signs of meningismus. Cardiovascular:     Rate and Rhythm: Normal rate and regular rhythm.  Pulmonary:     Effort: Pulmonary effort is  normal.     Breath sounds: Normal breath sounds.  Abdominal:     Palpations: Abdomen is soft.     Tenderness: There is no abdominal tenderness.  Musculoskeletal:        General: Normal range of motion.     Cervical back: Normal range of motion and neck supple. No tenderness or bony tenderness.  Skin:    General: Skin is warm and dry.  Neurological:     Mental Status: He is alert and oriented to person, place, and time.     GCS: GCS eye subscore is 4. GCS verbal subscore is 5. GCS motor subscore is 6.     Cranial Nerves: No cranial nerve deficit.     Sensory: No sensory deficit.     Motor: No abnormal muscle tone.     Coordination: Coordination normal.     Gait: Gait normal.     Deep Tendon Reflexes: Reflexes are normal and symmetric.     Comments: Patient ambulatory in hallway without any difficulties.     (all labs ordered are listed, but only abnormal results are displayed) Labs Reviewed  COMPREHENSIVE METABOLIC PANEL WITH GFR - Abnormal; Notable for the following components:      Result Value   CO2 21 (*)    Glucose, Bld 133 (*)    All other components within normal limits  CBC  CBG MONITORING, ED    EKG: EKG Interpretation Date/Time:  Tuesday April 13 2024 12:50:37 EDT Ventricular Rate:  86 PR Interval:  156 QRS Duration:  100 QT Interval:  348 QTC Calculation: 416 R Axis:   90  Text Interpretation: Normal sinus rhythm Rightward axis When compared with ECG of 19-Jun-2018 07:11, No significant change was found Confirmed by Doretha Folks ([id number]) on 04/13/2024 6:01:59 PM  Radiology: CT ANGIO HEAD NECK W WO CM Result Date: 04/13/2024 EXAM: CTA Head and Neck with Intravenous Contrast. CT Head without Contrast. CLINICAL HISTORY: 2 weeks posterior neck pain, posterior head pain and headache, dizziness. Table formatting from the original note was not included. Arrives POV with complaints of worsening headache radiating to his neck and dizziness x2 weeks. Patient  states that he went to his PCP and they gave him migraine medications that the patient reports that he has had no relief since taking. Reports a suspected diagnosis of vertigo. TECHNIQUE: Axial CTA images of the head and neck performed with intravenous contrast. MIP reconstructed images were created and reviewed. Axial computed tomography images of the head/brain performed without intravenous contrast. Note: Per PQRS, the description of internal carotid artery percent stenosis, including 0 percent or normal exam, is based on [place] American Symptomatic Carotid Endarterectomy Trial (NASCET) criteria. Dose reduction technique was used including one or more of the following: automated  exposure control, adjustment of mA and kV according to patient size, and/or iterative reconstruction. CONTRAST: Without and with; 75mL (iohexol  (OMNIPAQUE ) 350 MG/ML injection 75 mL IOHEXOL  350 MG/ML SOLN) COMPARISON: None provided. FINDINGS: CT HEAD: BRAIN: No acute intraparenchymal hemorrhage. No mass lesion. No CT evidence for acute territorial infarct. No midline shift or extra-axial collection. VENTRICLES: No hydrocephalus. ORBITS: The orbits are unremarkable. SINUSES AND MASTOIDS: The paranasal sinuses and mastoid air cells are clear. CTA NECK: COMMON CAROTID ARTERIES: No significant stenosis. No dissection or occlusion. INTERNAL CAROTID ARTERIES: No stenosis by NASCET criteria. No dissection or occlusion. VERTEBRAL ARTERIES: No significant stenosis. No dissection or occlusion. CTA HEAD: ANTERIOR CEREBRAL ARTERIES: No significant stenosis. No occlusion. No aneurysm. MIDDLE CEREBRAL ARTERIES: No significant stenosis. No occlusion. No aneurysm. POSTERIOR CEREBRAL ARTERIES: No significant stenosis. No occlusion. No aneurysm. BASILAR ARTERY: No significant stenosis. No occlusion. No aneurysm. SOFT TISSUES: No acute finding. No masses or lymphadenopathy. BONES: No acute osseous abnormality. Status post posterior decompression and  instrumented fusion at C3-C6. 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 IMPRESSION: 1. No acute intracranial findings. 2. No evidence of significant stenosis, aneurysmal dilatation, or dissection involving the arteries of the head and neck. Electronically signed by: Franky Stanford MD 04/13/2024 07:11 PM EDT RP Workstation: [id number]     Procedures   Medications Ordered in the ED - No data to display  ED Course  Patient seen and examined. History obtained directly from patient. Work-up including labs, imaging, EKG ordered in triage, if performed, were reviewed.    Labs/EKG: Independently reviewed and interpreted.  This included: CBC unremarkable with normal white and red blood cell count; CMP glucose 133, normal renal function, otherwise unremarkable.  Imaging: Due to new headache with associated neck pain, CT angio of the head and neck ordered.  Patient with reassuring neurologic exam, at baseline.  He is ambulatory without any difficulties.  Medications/Fluids: None ordered  Most recent vital signs reviewed and are as follows: BP 134/82 (BP Location: Right Arm)   Pulse 78   Temp 97.6 F (36.4 C) (Oral)   Resp 18   Ht 5' 7 (1.702 m)   Wt 104.3 kg   SpO2 97%   BMI 36.02 kg/m   Initial impression: Neck pain and headache, abrupt change in pattern and nature of headache.  May be related to previous surgery or arthritis in the neck.  Would like to rule out evidence of dissection or aneurysm.  7:28 PM Reassessment performed. Patient appears comfortable.  He is requesting something to eat, wife bringing him food.  Imaging personally visualized and interpreted including: CTA of the head and neck did not show any blood vessel disease or signs of stroke.  Fusion C3-C6.  Reviewed pertinent lab work and imaging with patient and family member at bedside. Questions answered.   Most current vital signs reviewed and are as follows: BP 129/76 (BP Location:  Right Arm)   Pulse 79   Temp 98 F (36.7 C) (Oral)   Resp 16   Ht 5' 7 (1.702 m)   Wt 104.3 kg   SpO2 97%   BMI 36.02 kg/m   Plan: Discharge to home.   Prescriptions written for: Medrol  Dosepak, discussed need to monitor blood sugars.  Patient states that he has muscle relaxers at home that do not help in general.  Other home care instructions discussed: Continue OTC meds, ice/heat, gentle stretching.  ED return instructions discussed:  Patient counseled to return if they have weakness  in their arms or legs, slurred speech, trouble walking or talking, confusion, trouble with their balance, or if they have any other concerns. Patient verbalizes understanding and agrees with plan.   Follow-up instructions discussed: Patient encouraged to follow-up with their PCP in 7 days.  Patient states that he has an appointment next week with his spine provider.                                  Medical Decision Making Amount and/or Complexity of Data Reviewed Labs: ordered. Radiology: ordered.  Risk Prescription drug management.   In regards to the patient's headache, critical differentials were considered including subarachnoid hemorrhage, intracerebral hemorrhage, epidural/subdural hematoma, pituitary apoplexy, vertebral/carotid artery dissection, giant cell arteritis, central venous thrombosis, reversible cerebral vasoconstriction, acute angle closure glaucoma, idiopathic intracranial hypertension, bacterial meningitis, viral encephalitis, carbon monoxide poisoning, posterior reversible encephalopathy syndrome, pre-eclampsia.   Reg flag symptoms related to these causes were considered including systemic symptoms (fever, weight loss), neurologic symptoms (confusion, mental status change, vision change, associated seizure), acute or sudden thunderclap onset, patient age 16 or older with new or progressive headache, patient of any age with first headache or change in headache pattern, pregnant  or postpartum status, history of HIV or other immunocompromise, history of cancer, headache occurring with exertion, associated neck or shoulder pain, associated traumatic injury, concurrent use of anticoagulation, family history of spontaneous SAH, and concurrent drug use.    Other benign, more common causes of headache were considered including migraine, tension-type headache, cluster headache, referred pain from other cause such as sinus infection, dental pain, trigeminal neuralgia.   On exam, patient has a reassuring neuro exam including baseline mental status, no significant neck pain or meningeal signs, no signs of severe infection or fever.   Given new type of headache with significant neck symptoms, started while washing car, CTA ordered to rule out aneurysm or dissection.  Fortunately imaging was negative.  This raises suspicion for irritation from the cervical nerves and musculature.  The patient's vital signs, pertinent lab work and imaging were reviewed and interpreted as discussed in the ED course. Hospitalization was considered for further testing, treatments, or serial exams/observation. However as patient is well-appearing, has a stable exam over the course of their evaluation, and reassuring studies today, I do not feel that they warrant admission at this time. This plan was discussed with the patient who verbalizes agreement and comfort with this plan and seems reliable and able to return to the Emergency Department with worsening or changing symptoms.       Final diagnoses:  Neck pain  Acute nonintractable headache, unspecified headache type    ED Discharge Orders          Ordered    methylPREDNISolone  (MEDROL  DOSEPAK) 4 MG TBPK tablet        04/13/24 1927               Desiderio Chew, PA-C 04/13/24 1931    Doretha Folks, MD 04/15/24 2054

## 2024-04-13 NOTE — ED Triage Notes (Signed)
 Arrives POV with complaints of worsening headache radiating to his neck and dizziness x2 weeks. Patient states that he went to his PCP and they gave him migraine medications that the patient reports that he has had no relief since taking.   Reports a suspected diagnosis of vertigo.

## 2024-04-13 NOTE — ED Notes (Signed)
 Pt appears comfortable and laughing with his wife in the room. Pt has a complaint of being hungry and is wanted to put an order in to get something to eat as soon as he gets the ok by his provider.

## 2024-04-13 NOTE — Discharge Instructions (Signed)
 Please read and follow all provided instructions.  Your diagnoses today include:  1. Neck pain   2. Acute nonintractable headache, unspecified headache type     Tests performed today include: CT of your head and neck which was normal and did not show any serious cause of your headache, including strokes, bleeding, dissections in the blood vessels or aneurysms. Vital signs. See below for your results today.   Medications:  Medrol  dosepak - steroid medicine   It is best to take this medication in the morning to prevent sleeping problems. If you are diabetic, monitor your blood sugar closely and stop taking if blood sugar is over 300. Take with food to prevent stomach upset.   Take any prescribed medications only as directed.  Additional information:  Follow any educational materials contained in this packet.  You are having a headache. No specific cause was found today for your headache. It may have been a migraine or other cause of headache. Stress, anxiety, fatigue, and depression are common triggers for headaches.   Your headache today does not appear to be life-threatening or require hospitalization, but often the exact cause of headaches is not determined in the emergency department. Therefore, follow-up with your doctor is very important to find out what may have caused your headache and whether or not you need any further diagnostic testing or treatment.   Sometimes headaches can appear benign (not harmful), but then more serious symptoms can develop which should prompt an immediate re-evaluation by your doctor or the emergency department.  BE VERY CAREFUL not to take multiple medicines containing Tylenol  (also called acetaminophen ). Doing so can lead to an overdose which can damage your liver and cause liver failure and possibly death.   Follow-up instructions: Please follow-up with your primary care provider and spine doctor in the next 7 days for further evaluation of your  symptoms.   Return instructions:  Please return to the Emergency Department if you experience worsening symptoms. Return if the medications do not resolve your headache, if it recurs, or if you have multiple episodes of vomiting or cannot keep down fluids. Return if you have a change from the usual headache. RETURN IMMEDIATELY IF you: Develop a sudden, severe headache Develop confusion or become poorly responsive or faint Develop a fever above 100.90F or problem breathing Have a change in speech, vision, swallowing, or understanding Develop new weakness, numbness, tingling, incoordination in your arms or legs Have a seizure Please return if you have any other emergent concerns.  Additional Information:  Your vital signs today were: BP 129/76 (BP Location: Right Arm)   Pulse 79   Temp 98 F (36.7 C) (Oral)   Resp 16   Ht 5' 7 (1.702 m)   Wt 104.3 kg   SpO2 97%   BMI 36.02 kg/m  If your blood pressure (BP) was elevated above 135/85 this visit, please have this repeated by your doctor within one month. --------------
